# Patient Record
Sex: Female | Born: 1980 | Race: White | Hispanic: No | Marital: Married | Smoking: Never smoker
Health system: Southern US, Community
[De-identification: ages and names within clinical notes are randomized; demographics above are authoritative.]

## PROBLEM LIST (undated history)

## (undated) DIAGNOSIS — R748 Abnormal levels of other serum enzymes: Secondary | ICD-10-CM

## (undated) DIAGNOSIS — R1013 Epigastric pain: Secondary | ICD-10-CM

## (undated) HISTORY — PX: DILATION AND CURETTAGE OF UTERUS: SHX78

---

## 2012-05-03 ENCOUNTER — Ambulatory Visit (INDEPENDENT_AMBULATORY_CARE_PROVIDER_SITE_OTHER): Payer: BC Managed Care – PPO | Admitting: Family Medicine

## 2012-05-03 VITALS — BP 90/58 | HR 58 | Temp 98.8°F | Resp 16 | Ht 66.0 in | Wt 131.0 lb

## 2012-05-03 DIAGNOSIS — J329 Chronic sinusitis, unspecified: Secondary | ICD-10-CM

## 2012-05-03 DIAGNOSIS — J029 Acute pharyngitis, unspecified: Secondary | ICD-10-CM

## 2012-05-03 LAB — POCT RAPID STREP A (OFFICE): Rapid Strep A Screen: NEGATIVE

## 2012-05-03 MED ORDER — AMOXICILLIN 875 MG PO TABS: 875.0000 mg | ORAL_TABLET | Freq: Two times a day (BID) | ORAL | Status: DC

## 2012-05-03 NOTE — Progress Notes (Signed)
    Patient ID: Anita Reyes MRN: [id number], DOB: 04-Apr-1981, 31 y.o. Date of Encounter: 05/03/2012, 11:32 AM  Primary Physician: Provider Not In System  Chief Complaint:  Chief Complaint  Patient presents with  . Cough    4 days  . Sore Throat    started yesterday  . Generalized Body Aches    HPI: 31 y.o. year old female presents with 3 day history of nasal congestion, post nasal drip, sore throat, sinus pressure, and cough. Afebrile. No chills. Nasal congestion thick and green/yellow. Cough is productive secondary to post nasal drip and not associated with time of day. Ears feel full, leading to sensation of muffled hearing. Has tried OTC cold preps without success. No GI complaints.   No recent antibiotics, recent travels, or sick contacts   No leg trauma, sedentary periods, h/o cancer, or tobacco use.  Patient is nursing.  History reviewed. No pertinent past medical history.   Home Meds: Prior to Admission medications   Not on File    Allergies: No Known Allergies  History   Social History  . Marital Status: Married    Spouse Name: N/A    Number of Children: N/A  . Years of Education: N/A   Occupational History  . Not on file.   Social History Main Topics  . Smoking status: Never Smoker   . Smokeless tobacco: Not on file  . Alcohol Use: Not on file  . Drug Use: Not on file  . Sexually Active: Not on file   Other Topics Concern  . Not on file   Social History Narrative  . No narrative on file     Review of Systems: Constitutional: negative for chills, fever, night sweats or weight changes Cardiovascular: negative for chest pain or palpitations Respiratory: negative for hemoptysis, wheezing, or shortness of breath Abdominal: negative for abdominal pain, nausea, vomiting or diarrhea Dermatological: negative for rash Neurologic: negative for headache   Physical Exam: Blood pressure 90/58, pulse 58, temperature 98.8 F (37.1 C), temperature  source Oral, resp. rate 16, height 5\' 6"  (1.676 m), weight 131 lb (59.421 kg), SpO2 98.00%., Body mass index is 21.14 kg/(m^2). General: Well developed, well nourished, in no acute distress. Head: Normocephalic, atraumatic, eyes without discharge, sclera non-icteric, nares are congested. Bilateral auditory canals clear, TM's are without perforation, pearly grey with reflective cone of light bilaterally. Serous effusion bilaterally behind TM's. Maxillary sinus TTP. Oral cavity moist, dentition normal. Posterior pharynx with post nasal drip and moderate erythema. No peritonsillar abscess or tonsillar exudate. Neck: Supple. No thyromegaly. Full ROM. No lymphadenopathy. Lungs: Clear bilaterally to auscultation without wheezes, rales, or rhonchi. Breathing is unlabored.  Heart: RRR with S1 S2. No murmurs, rubs, or gallops appreciated. Msk:  Strength and tone normal for age. Extremities: No clubbing or cyanosis. No edema. Neuro: Alert and oriented X 3. Moves all extremities spontaneously. CNII-XII grossly in tact. Psych:  Responds to questions appropriately with a normal affect.   Results for orders placed in visit on 05/03/12  POCT RAPID STREP A (OFFICE)      Component Value Range   Rapid Strep A Screen Negative  Negative      ASSESSMENT AND PLAN:  31 y.o. year old female with pharyngitis and sinus congestion while nursing. -  -Tylenol/Motrin prn -Rest/fluids -RTC precautions -RTC 3-5 days if no improvement  Signed, Elvina Sidle, MD 05/03/2012 11:32 AM

## 2016-01-19 DIAGNOSIS — Z3201 Encounter for pregnancy test, result positive: Secondary | ICD-10-CM | POA: Diagnosis not present

## 2016-03-24 DIAGNOSIS — O09522 Supervision of elderly multigravida, second trimester: Secondary | ICD-10-CM | POA: Diagnosis not present

## 2016-06-07 DIAGNOSIS — Z3A28 28 weeks gestation of pregnancy: Secondary | ICD-10-CM | POA: Diagnosis not present

## 2016-06-07 DIAGNOSIS — Z3483 Encounter for supervision of other normal pregnancy, third trimester: Secondary | ICD-10-CM | POA: Diagnosis not present

## 2016-06-23 DIAGNOSIS — Z23 Encounter for immunization: Secondary | ICD-10-CM | POA: Diagnosis not present

## 2016-07-28 DIAGNOSIS — Z3685 Encounter for antenatal screening for Streptococcus B: Secondary | ICD-10-CM | POA: Diagnosis not present

## 2016-08-01 DIAGNOSIS — Z0100 Encounter for examination of eyes and vision without abnormal findings: Secondary | ICD-10-CM | POA: Diagnosis not present

## 2016-08-12 DIAGNOSIS — O471 False labor at or after 37 completed weeks of gestation: Secondary | ICD-10-CM | POA: Diagnosis not present

## 2016-08-19 DIAGNOSIS — O34219 Maternal care for unspecified type scar from previous cesarean delivery: Secondary | ICD-10-CM | POA: Diagnosis not present

## 2016-08-19 DIAGNOSIS — Z3A00 Weeks of gestation of pregnancy not specified: Secondary | ICD-10-CM | POA: Diagnosis not present

## 2016-08-19 DIAGNOSIS — Z331 Pregnant state, incidental: Secondary | ICD-10-CM | POA: Diagnosis not present

## 2016-08-19 DIAGNOSIS — Z3A39 39 weeks gestation of pregnancy: Secondary | ICD-10-CM | POA: Diagnosis not present

## 2016-08-20 DIAGNOSIS — O34219 Maternal care for unspecified type scar from previous cesarean delivery: Secondary | ICD-10-CM | POA: Diagnosis not present

## 2016-08-20 DIAGNOSIS — Z3A39 39 weeks gestation of pregnancy: Secondary | ICD-10-CM | POA: Diagnosis not present

## 2016-09-02 DIAGNOSIS — M6208 Separation of muscle (nontraumatic), other site: Secondary | ICD-10-CM | POA: Diagnosis not present

## 2016-10-04 DIAGNOSIS — M6208 Separation of muscle (nontraumatic), other site: Secondary | ICD-10-CM | POA: Diagnosis not present

## 2017-12-20 DIAGNOSIS — Z3A01 Less than 8 weeks gestation of pregnancy: Secondary | ICD-10-CM | POA: Diagnosis not present

## 2017-12-20 DIAGNOSIS — O09291 Supervision of pregnancy with other poor reproductive or obstetric history, first trimester: Secondary | ICD-10-CM | POA: Diagnosis not present

## 2017-12-20 DIAGNOSIS — Z3201 Encounter for pregnancy test, result positive: Secondary | ICD-10-CM | POA: Diagnosis not present

## 2017-12-22 DIAGNOSIS — Z3A01 Less than 8 weeks gestation of pregnancy: Secondary | ICD-10-CM | POA: Diagnosis not present

## 2017-12-22 DIAGNOSIS — O09291 Supervision of pregnancy with other poor reproductive or obstetric history, first trimester: Secondary | ICD-10-CM | POA: Diagnosis not present

## 2017-12-25 DIAGNOSIS — Z3201 Encounter for pregnancy test, result positive: Secondary | ICD-10-CM | POA: Diagnosis not present

## 2017-12-25 DIAGNOSIS — O09299 Supervision of pregnancy with other poor reproductive or obstetric history, unspecified trimester: Secondary | ICD-10-CM | POA: Diagnosis not present

## 2017-12-25 DIAGNOSIS — Z3A08 8 weeks gestation of pregnancy: Secondary | ICD-10-CM | POA: Diagnosis not present

## 2017-12-25 DIAGNOSIS — Z6823 Body mass index (BMI) 23.0-23.9, adult: Secondary | ICD-10-CM | POA: Diagnosis not present

## 2018-01-12 DIAGNOSIS — Z3A10 10 weeks gestation of pregnancy: Secondary | ICD-10-CM | POA: Diagnosis not present

## 2018-01-12 DIAGNOSIS — Z124 Encounter for screening for malignant neoplasm of cervix: Secondary | ICD-10-CM | POA: Diagnosis not present

## 2018-01-12 DIAGNOSIS — Z113 Encounter for screening for infections with a predominantly sexual mode of transmission: Secondary | ICD-10-CM | POA: Diagnosis not present

## 2018-01-12 DIAGNOSIS — Z01419 Encounter for gynecological examination (general) (routine) without abnormal findings: Secondary | ICD-10-CM | POA: Diagnosis not present

## 2018-01-12 DIAGNOSIS — O09299 Supervision of pregnancy with other poor reproductive or obstetric history, unspecified trimester: Secondary | ICD-10-CM | POA: Diagnosis not present

## 2018-01-12 DIAGNOSIS — Z118 Encounter for screening for other infectious and parasitic diseases: Secondary | ICD-10-CM | POA: Diagnosis not present

## 2018-01-12 LAB — OB RESULTS CONSOLE ABO/RH: RH Type: POSITIVE

## 2018-01-12 LAB — OB RESULTS CONSOLE GC/CHLAMYDIA
Chlamydia: NEGATIVE
GC PROBE AMP, GENITAL: NEGATIVE

## 2018-01-12 LAB — OB RESULTS CONSOLE ANTIBODY SCREEN: Antibody Screen: NEGATIVE

## 2018-01-12 LAB — OB RESULTS CONSOLE RUBELLA ANTIBODY, IGM: Rubella: IMMUNE

## 2018-01-12 LAB — OB RESULTS CONSOLE RPR: RPR: NONREACTIVE

## 2018-01-12 LAB — OB RESULTS CONSOLE HEPATITIS B SURFACE ANTIGEN: Hepatitis B Surface Ag: NEGATIVE

## 2018-01-12 LAB — OB RESULTS CONSOLE HIV ANTIBODY (ROUTINE TESTING): HIV: NONREACTIVE

## 2018-01-25 DIAGNOSIS — Z3A12 12 weeks gestation of pregnancy: Secondary | ICD-10-CM | POA: Diagnosis not present

## 2018-01-25 DIAGNOSIS — Z23 Encounter for immunization: Secondary | ICD-10-CM | POA: Diagnosis not present

## 2018-01-25 DIAGNOSIS — O09291 Supervision of pregnancy with other poor reproductive or obstetric history, first trimester: Secondary | ICD-10-CM | POA: Diagnosis not present

## 2018-02-21 DIAGNOSIS — O09292 Supervision of pregnancy with other poor reproductive or obstetric history, second trimester: Secondary | ICD-10-CM | POA: Diagnosis not present

## 2018-02-21 DIAGNOSIS — Z3A16 16 weeks gestation of pregnancy: Secondary | ICD-10-CM | POA: Diagnosis not present

## 2018-02-21 DIAGNOSIS — Z361 Encounter for antenatal screening for raised alphafetoprotein level: Secondary | ICD-10-CM | POA: Diagnosis not present

## 2018-03-09 DIAGNOSIS — Z3A18 18 weeks gestation of pregnancy: Secondary | ICD-10-CM | POA: Diagnosis not present

## 2018-03-09 DIAGNOSIS — O09292 Supervision of pregnancy with other poor reproductive or obstetric history, second trimester: Secondary | ICD-10-CM | POA: Diagnosis not present

## 2018-04-10 DIAGNOSIS — Z3A23 23 weeks gestation of pregnancy: Secondary | ICD-10-CM | POA: Diagnosis not present

## 2018-04-10 DIAGNOSIS — O09292 Supervision of pregnancy with other poor reproductive or obstetric history, second trimester: Secondary | ICD-10-CM | POA: Diagnosis not present

## 2018-05-09 NOTE — L&D Delivery Note (Signed)
Delivery Note:   Complete dilation at 1359  07/31/2018  Onset of pushing at 1400 FHR second stage Category 2, variable decels nadir 60 Rapid progress from 4 cm to complete over 30 min. Pushed in L land R lateral position, L lateral for delivery Congested perineum and labia majus / varicosities  Analgesia Eliezer Lofts intrapartum:Epidural   Delivery of a Live born female  Position: cephalic OA to ROT, easy shoulders and body Birth Weight:  pending APGAR: 8, 9  Newborn Delivery   Birth date/time:  07/31/2018 14:29:00 Delivery type:  Vaginal, Spontaneous     Nuchal Cord: tight x 1, removed after delivery of body, somersault body on perineum Cord double clamped after cessation of pulsation, cut by FOB.  Collection of cord blood donation-None  Arterial cord blood sample-No    Placenta delivered-Spontaneous  with 3 vessels . Placenta to L&D for disposal. Uterine tone firm, bleeding small  Anesthesia:Epidural  Labial Laceration:2nd degree;Perineal  Repair: 3.0 vicryl Est. Blood Loss (mL):200.00   Complications: ,Other Labor Complications:  Mom to postpartum.  Baby to Couplet care / Skin to Skin.  Delivery Report:  Review the Delivery Report for details.     Signed: [name], CNM, MSN 07/31/2018, 2:59 PM

## 2018-05-11 DIAGNOSIS — O09292 Supervision of pregnancy with other poor reproductive or obstetric history, second trimester: Secondary | ICD-10-CM | POA: Diagnosis not present

## 2018-05-11 DIAGNOSIS — Z3689 Encounter for other specified antenatal screening: Secondary | ICD-10-CM | POA: Diagnosis not present

## 2018-05-11 DIAGNOSIS — Z3A27 27 weeks gestation of pregnancy: Secondary | ICD-10-CM | POA: Diagnosis not present

## 2018-05-22 DIAGNOSIS — O09293 Supervision of pregnancy with other poor reproductive or obstetric history, third trimester: Secondary | ICD-10-CM | POA: Diagnosis not present

## 2018-05-22 DIAGNOSIS — Z3689 Encounter for other specified antenatal screening: Secondary | ICD-10-CM | POA: Diagnosis not present

## 2018-05-22 DIAGNOSIS — Z3A29 29 weeks gestation of pregnancy: Secondary | ICD-10-CM | POA: Diagnosis not present

## 2018-06-06 DIAGNOSIS — Z3A31 31 weeks gestation of pregnancy: Secondary | ICD-10-CM | POA: Diagnosis not present

## 2018-06-06 DIAGNOSIS — Z23 Encounter for immunization: Secondary | ICD-10-CM | POA: Diagnosis not present

## 2018-06-06 DIAGNOSIS — O09293 Supervision of pregnancy with other poor reproductive or obstetric history, third trimester: Secondary | ICD-10-CM | POA: Diagnosis not present

## 2018-06-20 DIAGNOSIS — O09293 Supervision of pregnancy with other poor reproductive or obstetric history, third trimester: Secondary | ICD-10-CM | POA: Diagnosis not present

## 2018-06-20 DIAGNOSIS — Z6833 Body mass index (BMI) 33.0-33.9, adult: Secondary | ICD-10-CM | POA: Diagnosis not present

## 2018-06-20 DIAGNOSIS — O403XX0 Polyhydramnios, third trimester, not applicable or unspecified: Secondary | ICD-10-CM | POA: Diagnosis not present

## 2018-07-06 DIAGNOSIS — Z3685 Encounter for antenatal screening for Streptococcus B: Secondary | ICD-10-CM | POA: Diagnosis not present

## 2018-07-06 DIAGNOSIS — Z3A35 35 weeks gestation of pregnancy: Secondary | ICD-10-CM | POA: Diagnosis not present

## 2018-07-06 DIAGNOSIS — O09293 Supervision of pregnancy with other poor reproductive or obstetric history, third trimester: Secondary | ICD-10-CM | POA: Diagnosis not present

## 2018-07-06 DIAGNOSIS — O3663X0 Maternal care for excessive fetal growth, third trimester, not applicable or unspecified: Secondary | ICD-10-CM | POA: Diagnosis not present

## 2018-07-06 DIAGNOSIS — O403XX0 Polyhydramnios, third trimester, not applicable or unspecified: Secondary | ICD-10-CM | POA: Diagnosis not present

## 2018-07-06 LAB — OB RESULTS CONSOLE GBS: STREP GROUP B AG: NEGATIVE

## 2018-07-18 DIAGNOSIS — Z3A37 37 weeks gestation of pregnancy: Secondary | ICD-10-CM | POA: Diagnosis not present

## 2018-07-18 DIAGNOSIS — O09523 Supervision of elderly multigravida, third trimester: Secondary | ICD-10-CM | POA: Diagnosis not present

## 2018-07-19 DIAGNOSIS — Z3A37 37 weeks gestation of pregnancy: Secondary | ICD-10-CM | POA: Diagnosis not present

## 2018-07-19 DIAGNOSIS — O09523 Supervision of elderly multigravida, third trimester: Secondary | ICD-10-CM | POA: Diagnosis not present

## 2018-07-19 DIAGNOSIS — O3663X0 Maternal care for excessive fetal growth, third trimester, not applicable or unspecified: Secondary | ICD-10-CM | POA: Diagnosis not present

## 2018-07-20 ENCOUNTER — Other Ambulatory Visit: Payer: Self-pay

## 2018-07-20 ENCOUNTER — Encounter (HOSPITAL_COMMUNITY): Payer: Self-pay

## 2018-07-20 ENCOUNTER — Inpatient Hospital Stay (HOSPITAL_COMMUNITY)
Admission: AD | Admit: 2018-07-20 | Discharge: 2018-07-20 | Disposition: A | Discharge status: Home / Self Care | Payer: BLUE CROSS/BLUE SHIELD | Attending: Obstetrics & Gynecology | Admitting: Obstetrics & Gynecology

## 2018-07-20 DIAGNOSIS — Z3A37 37 weeks gestation of pregnancy: Secondary | ICD-10-CM

## 2018-07-20 DIAGNOSIS — O479 False labor, unspecified: Secondary | ICD-10-CM

## 2018-07-20 DIAGNOSIS — O471 False labor at or after 37 completed weeks of gestation: Secondary | ICD-10-CM | POA: Diagnosis not present

## 2018-07-20 NOTE — MAU Note (Signed)
CTX every 5-7 minutes.  No LOF/VB.  2 cm on last exam.  + FM.

## 2018-07-20 NOTE — MAU Provider Note (Signed)
S: Ms. Anita Reyes is a 38 y.o. [id number] at [redacted]w[redacted]d  who presents to MAU today for labor evaluation.     Cervical exam by RN:  Dilation: 2.5 Effacement (%): 40, 50 Cervical Position: Middle Station: Ballotable Exam by:: [employer] CNM   Fetal Monitoring: Baseline: 135 Variability: moderate Accelerations: present Decelerations: variable x 1, isolated Contractions: 2-5 minutes  MDM NST reviewed and overall reactive with isolated variable while pt sitting up in bed, followed by 1+ hour of reactive NST without deceleration CNM called to room to evaluate cervix by RN, Cervix 2.5/60/-3, unchanged from RN exam.  No evidence of active labor. Pt with hx rapid labor, offered for her to stay and walk or go home now. Pt decided to walk but in 15-20 minutes asked to be discharged since contractions remained mild and unchanged. D/C home with labor precautions and fetal kick counts.  A: [facility] at [redacted]w[redacted]d  False labor  P: Discharge home Labor precautions and kick counts included in AVS Patient to follow-up with [facility] as scheduled  Patient may return to MAU as needed or when in labor   Hurshel Party, CNM 07/20/2018 4:42 AM

## 2018-07-20 NOTE — Discharge Instructions (Signed)
Reasons to return to MAU:  1.  Contractions are  5 minutes apart or less, each last 1 minute, these have been going on for 1-2 hours, and you cannot walk or talk during them 2.  You have a large gush of fluid, or a trickle of fluid that will not stop and you have to wear a pad 3.  You have bleeding that is bright red, heavier than spotting--like menstrual bleeding (spotting can be normal in early labor or after a check of your cervix) 4.  You do not feel the baby moving like he/she normally does   Pain Relief During Labor and Delivery Many things can cause pain during labor and delivery, including:  Pressure on bones and ligaments due to the baby moving through the pelvis.  Stretching of tissues due to the baby moving through the birth canal.  Muscle tension due to anxiety or nervousness.  The uterus tightening (contracting) and relaxing to help move the baby. There are many ways to deal with the pain of labor and delivery. They include:  Taking prenatal classes. Taking these classes helps you know what to expect during your babys birth. What you learn will increase your confidence and decrease your anxiety.  Practicing relaxation techniques or doing relaxing activities, such as: ? Focused breathing. ? Meditation. ? Visualization. ? Aroma therapy. ? Listening to your favorite music. ? Hypnosis.  Taking a warm shower or bath (hydrotherapy). This may: ? Provide comfort and relaxation. ? Lessen your perception of pain. ? Decrease the amount of pain medicine needed. ? Decrease the length of labor.  Getting a massage or counterpressure on your back.  Applying warm packs or ice packs.  Changing positions often, moving around, or using a birthing ball.  Getting: ? Pain medicine through an IV or injection into a muscle. ? Pain medicine inserted into your spinal column. ? Injections of sterile water just under the skin on your lower back (intradermal injections). ? Laughing gas  (nitrous oxide). Discuss your pain control options with your health care provider during your prenatal visits. Explore the options offered by your hospital or birth center. What kinds of medicine are available? There are two kinds of medicines that can be used to relieve pain during labor and delivery:  Analgesics. These medicines decrease pain without causing you to lose feeling or the ability to move your muscles.  Anesthetics. These medicines block feeling in the body and can decrease your ability to move freely. Both of these kinds of medicine can cause minor side effects, such as nausea, trouble concentrating, and sleepiness. They can also decrease the baby's heart rate before birth and affect the babys breathing rate after birth. For this reason, health care providers are careful about when and how much medicine is given. What are specific medicines and procedures that provide pain relief? Local Anesthetics Local anesthetics are used to numb a small area of the body. They may be used along with another kind of anesthetic or used to numb the nerves of the vagina, cervix, and perineum during the second stage of labor. General Anesthetics General anesthetics cause you to lose consciousness so you do not feel pain. They are usually only used for an emergency cesarean delivery. General anesthetics are given through an IV tube and a mask. Pudendal Block A pudendal block is a form of local anesthetic. It may be used to relieve the pain associated with pushing or stretching of the perineum at the time of delivery or to further  numb the perineum. A pudendal block is done by injecting numbing medicine through the vaginal wall into a nerve in the pelvis. Epidural Analgesia Epidural analgesia is given through a flexible IV catheter that is inserted into the lower back. Numbing medicine is delivered continuously to the area near your spinal column nerves (epidural space). After having this type of  analgesia, you may be able to move your legs but you most likely will not be able to walk. Depending on the amount of medicine given, you may lose all feeling in the lower half of your body, or you may retain some level of sensation, including the urge to push. Epidural analgesia can be used to provide pain relief for a vaginal birth. Spinal Block A spinal block is similar to epidural analgesia, but the medicine is injected into the spinal fluid instead of the epidural space. A spinal block is only given once. It starts to relieve pain quickly, but the pain relief lasts only 1-6 hours. Spinal blocks can be used for cesarean deliveries. Combined Spinal-Epidural (CSE) Block A CSE block combines the effects of a spinal block and epidural analgesia. The spinal block works quickly to block all pain. The epidural analgesia provides continuous pain relief, even after the effects of the spinal block have worn off. This information is not intended to replace advice given to you by your health care provider. Make sure you discuss any questions you have with your health care provider. Document Released: 08/11/2008 Document Revised: 10/02/2015 Document Reviewed: 09/16/2015 Elsevier Interactive Patient Education  2019 [employer].

## 2018-07-23 ENCOUNTER — Telehealth (HOSPITAL_COMMUNITY): Payer: Self-pay | Admitting: *Deleted

## 2018-07-23 ENCOUNTER — Encounter (HOSPITAL_COMMUNITY): Payer: Self-pay | Admitting: *Deleted

## 2018-07-25 DIAGNOSIS — Z3A38 38 weeks gestation of pregnancy: Secondary | ICD-10-CM | POA: Diagnosis not present

## 2018-07-25 DIAGNOSIS — O3663X0 Maternal care for excessive fetal growth, third trimester, not applicable or unspecified: Secondary | ICD-10-CM | POA: Diagnosis not present

## 2018-07-25 DIAGNOSIS — O09523 Supervision of elderly multigravida, third trimester: Secondary | ICD-10-CM | POA: Diagnosis not present

## 2018-07-31 ENCOUNTER — Inpatient Hospital Stay (HOSPITAL_COMMUNITY): Payer: BLUE CROSS/BLUE SHIELD | Admitting: Anesthesiology

## 2018-07-31 ENCOUNTER — Inpatient Hospital Stay (HOSPITAL_COMMUNITY): Payer: BLUE CROSS/BLUE SHIELD

## 2018-07-31 ENCOUNTER — Other Ambulatory Visit: Payer: Self-pay

## 2018-07-31 ENCOUNTER — Inpatient Hospital Stay (HOSPITAL_COMMUNITY)
Admission: AD | Admit: 2018-07-31 | Discharge: 2018-08-01 | DRG: 806 | Disposition: A | Discharge status: Home / Self Care | Payer: BLUE CROSS/BLUE SHIELD

## 2018-07-31 ENCOUNTER — Encounter (HOSPITAL_COMMUNITY): Payer: Self-pay

## 2018-07-31 DIAGNOSIS — N473 Deficient foreskin: Secondary | ICD-10-CM | POA: Diagnosis not present

## 2018-07-31 DIAGNOSIS — O878 Other venous complications in the puerperium: Secondary | ICD-10-CM | POA: Diagnosis present

## 2018-07-31 DIAGNOSIS — Z23 Encounter for immunization: Secondary | ICD-10-CM | POA: Diagnosis not present

## 2018-07-31 DIAGNOSIS — Z3A39 39 weeks gestation of pregnancy: Secondary | ICD-10-CM | POA: Diagnosis not present

## 2018-07-31 DIAGNOSIS — O34219 Maternal care for unspecified type scar from previous cesarean delivery: Principal | ICD-10-CM | POA: Diagnosis not present

## 2018-07-31 DIAGNOSIS — O34211 Maternal care for low transverse scar from previous cesarean delivery: Secondary | ICD-10-CM | POA: Diagnosis not present

## 2018-07-31 DIAGNOSIS — O2243 Hemorrhoids in pregnancy, third trimester: Secondary | ICD-10-CM | POA: Diagnosis not present

## 2018-07-31 LAB — TYPE AND SCREEN
ABO/RH(D): A POS
Antibody Screen: NEGATIVE

## 2018-07-31 LAB — CBC
HCT: 37.8 % (ref 36.0–46.0)
Hemoglobin: 12.5 g/dL (ref 12.0–15.0)
MCH: 29.2 pg (ref 26.0–34.0)
MCHC: 33.1 g/dL (ref 30.0–36.0)
MCV: 88.3 fL (ref 80.0–100.0)
Platelets: 175 10*3/uL (ref 150–400)
RBC: 4.28 MIL/uL (ref 3.87–5.11)
RDW: 14.5 % (ref 11.5–15.5)
WBC: 9.2 10*3/uL (ref 4.0–10.5)
nRBC: 0 % (ref 0.0–0.2)

## 2018-07-31 LAB — ABO/RH: ABO/RH(D): A POS

## 2018-07-31 MED ORDER — PHENYLEPHRINE 40 MCG/ML (10ML) SYRINGE FOR IV PUSH (FOR BLOOD PRESSURE SUPPORT)
80.0000 ug | PREFILLED_SYRINGE | INTRAVENOUS | Status: DC | PRN
  Filled 2018-07-31: qty 10

## 2018-07-31 MED ORDER — SODIUM CHLORIDE (PF) 0.9 % IJ SOLN
INTRAMUSCULAR | Status: DC | PRN
  Administered 2018-07-31: 12 mL/h via EPIDURAL

## 2018-07-31 MED ORDER — FENTANYL-BUPIVACAINE-NACL 0.5-0.125-0.9 MG/250ML-% EP SOLN
12.0000 mL/h | EPIDURAL | Status: DC | PRN
  Filled 2018-07-31: qty 250

## 2018-07-31 MED ORDER — LACTATED RINGERS IV SOLN: 500.0000 mL | Freq: Once | INTRAVENOUS | Status: DC

## 2018-07-31 MED ORDER — WITCH HAZEL-GLYCERIN EX PADS
1.0000 "application " | MEDICATED_PAD | CUTANEOUS | Status: DC | PRN
  Administered 2018-08-01: 1 via TOPICAL

## 2018-07-31 MED ORDER — ONDANSETRON HCL 4 MG/2ML IJ SOLN: 4.0000 mg | Freq: Four times a day (QID) | INTRAMUSCULAR | Status: DC | PRN

## 2018-07-31 MED ORDER — OXYTOCIN BOLUS FROM INFUSION
500.0000 mL | Freq: Once | INTRAVENOUS | Status: AC
  Administered 2018-07-31: 500 mL via INTRAVENOUS

## 2018-07-31 MED ORDER — OXYCODONE-ACETAMINOPHEN 5-325 MG PO TABS
1.0000 | ORAL_TABLET | ORAL | Status: DC | PRN
  Administered 2018-07-31: 1 via ORAL
  Filled 2018-07-31: qty 1

## 2018-07-31 MED ORDER — OXYTOCIN 10 UNIT/ML IJ SOLN: 10.0000 [IU] | Freq: Once | INTRAMUSCULAR | Status: DC

## 2018-07-31 MED ORDER — PRENATAL MULTIVITAMIN CH
1.0000 | ORAL_TABLET | Freq: Every day | ORAL | Status: DC
  Administered 2018-08-01: 1 via ORAL
  Filled 2018-07-31: qty 1

## 2018-07-31 MED ORDER — EPHEDRINE 5 MG/ML INJ: 10.0000 mg | INTRAVENOUS | Status: DC | PRN

## 2018-07-31 MED ORDER — ONDANSETRON HCL 4 MG PO TABS: 4.0000 mg | ORAL_TABLET | ORAL | Status: DC | PRN

## 2018-07-31 MED ORDER — FLEET ENEMA 7-19 GM/118ML RE ENEM: 1.0000 | ENEMA | Freq: Every day | RECTAL | Status: DC | PRN

## 2018-07-31 MED ORDER — LIDOCAINE HCL (PF) 1 % IJ SOLN
INTRAMUSCULAR | Status: DC | PRN
  Administered 2018-07-31: 5 mL via EPIDURAL

## 2018-07-31 MED ORDER — OXYTOCIN 40 UNITS IN NORMAL SALINE INFUSION - SIMPLE MED
2.5000 [IU]/h | INTRAVENOUS | Status: DC
  Filled 2018-07-31: qty 1000

## 2018-07-31 MED ORDER — IBUPROFEN 600 MG PO TABS
600.0000 mg | ORAL_TABLET | Freq: Four times a day (QID) | ORAL | Status: DC
  Administered 2018-07-31 – 2018-08-01 (×4): 600 mg via ORAL
  Filled 2018-07-31 (×4): qty 1

## 2018-07-31 MED ORDER — TERBUTALINE SULFATE 1 MG/ML IJ SOLN: 0.2500 mg | Freq: Once | INTRAMUSCULAR | Status: DC | PRN

## 2018-07-31 MED ORDER — LACTATED RINGERS IV SOLN
500.0000 mL | INTRAVENOUS | Status: DC | PRN
  Administered 2018-07-31 (×3): 500 mL via INTRAVENOUS

## 2018-07-31 MED ORDER — DIPHENHYDRAMINE HCL 50 MG/ML IJ SOLN: 12.5000 mg | INTRAMUSCULAR | Status: DC | PRN

## 2018-07-31 MED ORDER — DIBUCAINE 1 % RE OINT: 1.0000 "application " | TOPICAL_OINTMENT | RECTAL | Status: DC | PRN

## 2018-07-31 MED ORDER — TETANUS-DIPHTH-ACELL PERTUSSIS 5-2.5-18.5 LF-MCG/0.5 IM SUSP: 0.5000 mL | Freq: Once | INTRAMUSCULAR | Status: DC

## 2018-07-31 MED ORDER — LIDOCAINE HCL (PF) 1 % IJ SOLN: 30.0000 mL | INTRAMUSCULAR | Status: DC | PRN

## 2018-07-31 MED ORDER — SIMETHICONE 80 MG PO CHEW
80.0000 mg | CHEWABLE_TABLET | ORAL | Status: DC | PRN
  Administered 2018-08-01: 80 mg via ORAL
  Filled 2018-07-31: qty 1

## 2018-07-31 MED ORDER — OXYTOCIN 40 UNITS IN NORMAL SALINE INFUSION - SIMPLE MED
1.0000 m[IU]/min | INTRAVENOUS | Status: DC
  Administered 2018-07-31: 2 m[IU]/min via INTRAVENOUS

## 2018-07-31 MED ORDER — PHENYLEPHRINE 40 MCG/ML (10ML) SYRINGE FOR IV PUSH (FOR BLOOD PRESSURE SUPPORT)
80.0000 ug | PREFILLED_SYRINGE | INTRAVENOUS | Status: DC | PRN
  Administered 2018-07-31 (×2): 80 ug via INTRAVENOUS

## 2018-07-31 MED ORDER — DOCUSATE SODIUM 100 MG PO CAPS
100.0000 mg | ORAL_CAPSULE | Freq: Two times a day (BID) | ORAL | Status: DC
  Administered 2018-07-31 – 2018-08-01 (×2): 100 mg via ORAL
  Filled 2018-07-31 (×2): qty 1

## 2018-07-31 MED ORDER — OXYCODONE HCL 5 MG PO TABS
5.0000 mg | ORAL_TABLET | ORAL | Status: DC | PRN
  Administered 2018-07-31 – 2018-08-01 (×2): 5 mg via ORAL
  Filled 2018-07-31 (×2): qty 1

## 2018-07-31 MED ORDER — BISACODYL 10 MG RE SUPP: 10.0000 mg | Freq: Every day | RECTAL | Status: DC | PRN

## 2018-07-31 MED ORDER — ACETAMINOPHEN 325 MG PO TABS
650.0000 mg | ORAL_TABLET | ORAL | Status: DC | PRN
  Administered 2018-08-01: 650 mg via ORAL
  Filled 2018-07-31: qty 2

## 2018-07-31 MED ORDER — OXYCODONE-ACETAMINOPHEN 5-325 MG PO TABS: 2.0000 | ORAL_TABLET | ORAL | Status: DC | PRN

## 2018-07-31 MED ORDER — SOD CITRATE-CITRIC ACID 500-334 MG/5ML PO SOLN: 30.0000 mL | ORAL | Status: DC | PRN

## 2018-07-31 MED ORDER — COCONUT OIL OIL
1.0000 "application " | TOPICAL_OIL | Status: DC | PRN
  Administered 2018-07-31: 1 via TOPICAL

## 2018-07-31 MED ORDER — ONDANSETRON HCL 4 MG/2ML IJ SOLN: 4.0000 mg | INTRAMUSCULAR | Status: DC | PRN

## 2018-07-31 MED ORDER — LACTATED RINGERS IV SOLN
INTRAVENOUS | Status: DC
  Administered 2018-07-31 (×2): 125 mL/h via INTRAVENOUS

## 2018-07-31 MED ORDER — ACETAMINOPHEN 325 MG PO TABS: 650.0000 mg | ORAL_TABLET | ORAL | Status: DC | PRN

## 2018-07-31 MED ORDER — BENZOCAINE-MENTHOL 20-0.5 % EX AERO
1.0000 "application " | INHALATION_SPRAY | CUTANEOUS | Status: DC | PRN
  Administered 2018-07-31: 1 via TOPICAL
  Filled 2018-07-31: qty 56

## 2018-07-31 MED ORDER — DIPHENHYDRAMINE HCL 25 MG PO CAPS: 25.0000 mg | ORAL_CAPSULE | Freq: Four times a day (QID) | ORAL | Status: DC | PRN

## 2018-07-31 NOTE — Progress Notes (Signed)
Abdominal binder placed by CNM Renae Fickle and Luz Brazen RN

## 2018-07-31 NOTE — Progress Notes (Signed)
Patient ID: Anita Reyes, female   DOB: Jan 04, 1981, 38 y.o.   MRN: [id number] S: Comfortable w/ epidural, Pitocin started at 2 mu/min. Hypotensive episode post epidural, now resolved.  O:  Vitals:   07/31/18 1200 07/31/18 1202 07/31/18 1205 07/31/18 1206  BP: (!) 92/47 (!) 92/47  (!) 116/40  Pulse: 81 81  75  Resp:      Temp:      TempSrc:      SpO2: 99%  99%   Weight:      Height:        FHR 140, mod var, + accels, couple variable decels with AROM, now resolved Ctx irregular  SVE 4/80/-1, vertex  AROM clear copious amt AF Abdominal binder on for laxity / prevent malposition  A/P:  IOL at term, TOLAC FHT cat 1 GBS neg Epidural effective  AROM  Pitocin titrate continue Anticipate VBAC   [name], MSN, CNM 07/31/2018, 12:10 PM

## 2018-07-31 NOTE — Anesthesia Procedure Notes (Signed)
Epidural Patient location during procedure: OB Start time: 07/31/2018 10:56 AM End time: 07/31/2018 11:09 AM  Staffing Anesthesiologist: Trevor Iha, MD Performed: anesthesiologist   Preanesthetic Checklist Completed: patient identified, site marked, surgical consent, pre-op evaluation, timeout performed, IV checked, risks and benefits discussed and monitors and equipment checked  Epidural Patient position: sitting Prep: site prepped and draped and DuraPrep Patient monitoring: continuous pulse ox and blood pressure Approach: midline Location: L3-L4 Injection technique: LOR air  Needle:  Needle type: Tuohy  Needle gauge: 17 G Needle length: 9 cm and 9 Needle insertion depth: 7 cm Catheter type: closed end flexible Catheter size: 19 Gauge Catheter at skin depth: 12 cm Test dose: negative  Assessment Events: blood not aspirated, injection not painful, no injection resistance, negative IV test and no paresthesia  Additional Notes Patient identified. Risks/Benefits/Options discussed with patient including but not limited to bleeding, infection, nerve damage, paralysis, failed block, incomplete pain control, headache, blood pressure changes, nausea, vomiting, reactions to medication both or allergic, itching and postpartum back pain. Confirmed with bedside nurse the patient's most recent platelet count. Confirmed with patient that they are not currently taking any anticoagulation, have any bleeding history or any family history of bleeding disorders. Patient expressed understanding and wished to proceed. All questions were answered. Sterile technique was used throughout the entire procedure. Please see nursing notes for vital signs. Test dose was given through epidural needle and negative prior to continuing to dose epidural or start infusion. Warning signs of high block given to the patient including shortness of breath, tingling/numbness in hands, complete motor block, or any  concerning symptoms with instructions to call for help. Patient was given instructions on fall risk and not to get out of bed. All questions and concerns addressed with instructions to call with any issues. 1 Attempt (S) . Patient tolerated procedure well.

## 2018-07-31 NOTE — Anesthesia Preprocedure Evaluation (Signed)
Anesthesia Evaluation  Patient identified by MRN, date of birth, ID band Patient awake    Reviewed: Allergy & Precautions, NPO status , Patient's Chart, lab work & pertinent test results  Airway Mallampati: II  TM Distance: >3 FB Neck ROM: Full    Dental no notable dental hx. (+) Teeth Intact, Dental Advisory Given   Pulmonary neg pulmonary ROS,    Pulmonary exam normal breath sounds clear to auscultation       Cardiovascular Exercise Tolerance: Good negative cardio ROS Normal cardiovascular exam Rhythm:Regular Rate:Normal     Neuro/Psych negative neurological ROS     GI/Hepatic negative GI ROS, Neg liver ROS,   Endo/Other    Renal/GU      Musculoskeletal   Abdominal   Peds  Hematology Hgb 12.5 Plt 175   Anesthesia Other Findings   Reproductive/Obstetrics (+) Pregnancy Pt is a Trial of Labor after C/S                             Anesthesia Physical Anesthesia Plan  ASA: II  Anesthesia Plan: Epidural   Post-op Pain Management:    Induction:   PONV Risk Score and Plan:   Airway Management Planned:   Additional Equipment:   Intra-op Plan:   Post-operative Plan:   Informed Consent: I have reviewed the patients History and Physical, chart, labs and discussed the procedure including the risks, benefits and alternatives for the proposed anesthesia with the patient or authorized representative who has indicated his/her understanding and acceptance.       Plan Discussed with:   Anesthesia Plan Comments:         Anesthesia Quick Evaluation

## 2018-07-31 NOTE — H&P (Signed)
  OB ADMISSION/ HISTORY & PHYSICAL:  Admission Date: 07/31/2018  7:40 AM  Admit Diagnosis: No admission diagnoses are documented for this encounter.    Anita Reyes is a 38 y.o. female presenting for elective IOL, desires VBAC.  Prenatal History: Anita Reyes   EDC : 08/05/2018, by Other [facility]  Prenatal care at [facility] since 1st trim.   Prenatal course complicated by: Hx G1 C/S for breech, VBAC x 3  Prenatal Labs: ABO, Rh: A (09/06 0000)  Antibody: Negative (09/06 0000) Rubella: Immune (09/06 0000)  RPR: Nonreactive (09/06 0000)  HBsAg: Negative (09/06 0000)  HIV: Non-reactive (09/06 0000)  GBS: Negative (02/28 0000)  1 hr Glucola : normal Genetic Screening: Quad screen wnl Ultrasound: normal anatomy, posterior placenta  Medical / Surgical History :  Past medical history: noncontributory   Past surgical history:  Past Surgical History:  Procedure Laterality Date  . CESAREAN SECTION    . DILATION AND CURETTAGE OF UTERUS       Family History: non-contributory   Social History:  reports that she has never smoked. She does not have any smokeless tobacco history on file. No history on file for alcohol and drug.   Allergies: Patient has no known allergies.   Current Medications at time of admission:  Medications Prior to Admission  Medication Sig Dispense Refill Last Dose  . Prenatal Vit-Fe Fumarate-FA (PRENATAL MULTIVITAMIN) TABS tablet Take 1 tablet by mouth daily at 12 noon.   Past Week at Unknown time     Review of Systems: ROS No HA/NV/RUQ pain No LOF/VB, + FM  Physical Exam: Vital signs and nursing notes reviewed.  ED Triage Vitals  Enc Vitals Group     BP 07/31/18 0824 114/67     Pulse Rate 07/31/18 0824 88     Resp 07/31/18 0824 16     Temp 07/31/18 0824 98.7 F (37.1 C)     Temp Source 07/31/18 0824 Oral     SpO2 --      Weight 07/31/18 0824 180 lb (81.6 kg)     Height 07/31/18 0824 5\' 5"  (1.651 m)     Head Circumference --       Peak Flow --      Pain Score 07/31/18 0825 0     Pain Loc --      Pain Edu? --      Excl. in GC? --      General: AAO x 3, NAD Heart: RRR Lungs:CTAB Abdomen: Gravid, NT, Leopold's vtx, EFW 7.7 lbs Abdominal laxity, binder applied Extremities: no edema Genitalia / VE: Dilation: 3 Effacement (%): 50 Station: -2 Presentation: Vertex Exam by:: D Paul CNM   FHR: 150 BPM, mod variability, + accels, no decels TOCO: Ctx q 2-3  Labs:   Pending T&S, CBC, RPR    Assessment:  38 y.o. Anita Reyes at [redacted]w[redacted]d for TOLAC Hx VBAC x 3  1. NIL 2. FHR category 1 3. GBS neg 4. Desires epidural in active labor 5. Breastfeeding 6. Placenta disposal per patient request  Plan:  1. Admit to BS 2. Routine L&D orders, Pitocin titrate to ctx, AROM when vertex well applied 3. Analgesia/anesthesia PRN  4. Anticipate VBAC   Dr Cherly Hensen notified of admission / plan of care   [name] CNM, MSN 07/31/2018, 9:16 AM

## 2018-08-01 LAB — RPR: RPR Ser Ql: NONREACTIVE

## 2018-08-01 MED ORDER — IBUPROFEN 600 MG PO TABS: 600.0000 mg | ORAL_TABLET | Freq: Four times a day (QID) | ORAL | 0 refills | Status: AC

## 2018-08-01 MED ORDER — SIMETHICONE 80 MG PO CHEW: 80.0000 mg | CHEWABLE_TABLET | ORAL | 0 refills | Status: AC | PRN

## 2018-08-01 NOTE — Lactation Note (Signed)
This note was copied from a baby's chart. Lactation Consultation Note  Patient Name: Anita Reyes Date: 08/01/2018 Reason for consult: Initial assessment;Term  P5 mother whose infant is now 62 hours old.  Mother breast fed her other 4 children for 1 year each.  The children range in ages from 2 years-9 years.  Mother would like an early discharge at 24 hours and plans to have the baby circumcised prior to discharge.  Mother had no questions/concerns related to breast feeding.  Even though mother is very knowledgeable about breast feeding and has a history of successful breast feeding I reviewed basic concepts with her.    Encouraged to feed 8-12 times/24 hours or sooner if baby shows cues.  Mother is familiar with feeding cues and hand expression.  Observed her expressing colostrum drops easily.  Colostrum container provided for any EBM she obtains.  Milk storage times reviewed and finger feeding demonstrated.    Observed mother latch baby to breast without difficulty.  Her breasts are soft and non tender and nipples are everted and short shafted.  Breast tissue is compressible.  LATCH score of 9.  Discussed engorgement prevention/treatment.  Manual pump with instructions for use given.  Mother did not want to review set up or flange size, stating she was familiar with the pump and also has larger flanges at home if needed.    Mother verbalized that, at about 57 weeks of age, she has gotten mastitis with every child.  She has a prescription for nipple cream at bedside that has helped her.  It expired in 2013 and suggested she ask her OB today to write a new prescription.  Also mentioned the [facility] from [facility].  Mother will do this.  I also suggested having an OP lactation consult with Jasmine December at about 4-5 weeks if mother feels like she is becoming engorged.  Mother feels like she can always sense when this is about to happen and sounded interested in seeing Jasmine December if needed.   Explained how she can call and make an OP appointment.    Mother will call if further questions arise prior to discharge.  Father is present and very supportive.  I feel comfortable with this family being discharged at 24 hours.   Maternal Data Formula Feeding for Exclusion: No Has patient been taught Hand Expression?: Yes Does the patient have breastfeeding experience prior to this delivery?: Yes  Feeding Feeding Type: Breast Fed  LATCH Score Latch: Grasps breast easily, tongue down, lips flanged, rhythmical sucking.  Audible Swallowing: A few with stimulation  Type of Nipple: Everted at rest and after stimulation  Comfort (Breast/Nipple): Soft / non-tender  Hold (Positioning): No assistance needed to correctly position infant at breast.  LATCH Score: 9  Interventions Interventions: Breast feeding basics reviewed;Skin to skin;Breast massage;Breast compression  Lactation Tools Discussed/Used WIC Program: No   Consult Status Consult Status: Complete Date: 08/02/18 Follow-up type: Call as needed    Namiko Pritts R Jordon Kristiansen 08/01/2018, 11:14 AM

## 2018-08-01 NOTE — Discharge Summary (Signed)
Obstetric Discharge Summary   Patient Name: Anita Reyes DOB: 09/09/1980 MRN: [id number]  Date of Admission: 07/31/2018 Date of Discharge: 08/01/2018 Date of Delivery: 07/31/2018 Gestational Age at Delivery: [redacted]w[redacted]d  Primary OB: [facility] - CNM Management; Colon Flattery  Antepartum complications:  Hx G1 C/S for breech, VBAC x 3 Prenatal Labs:  ABO, Rh: A (09/06 0000)  Antibody: Negative (09/06 0000) Rubella: Immune (09/06 0000)  RPR: Nonreactive (09/06 0000)  HBsAg: Negative (09/06 0000)  HIV: Non-reactive (09/06 0000)  GBS: Negative (02/28 0000)  1 hr Glucola : normal Genetic Screening: Quad screen wnl Ultrasound: normal anatomy, posterior placenta  Admitting Diagnosis: elective IOL at 39+2 weeks   Secondary Diagnoses: Patient Active Problem List   Diagnosis Date Noted  . Labor and delivery, indication for care 07/31/2018  . Postpartum care following vaginal delivery 3/24 07/31/2018  . Perineal laceration, second degree 07/31/2018  . VBAC, delivered 07/31/2018    Augmentation: AROM, Pitocin  Complications: None  Date of Delivery: 07/31/2018 Delivered By: D. Renae Fickle, CNM  Delivery Type: spontaneous vaginal delivery/ VBAC Anesthesia: epidural Placenta: sponatneous Laceration: 2nd degree  Episiotomy: none  Newborn Data: Live born female  Birth Weight: 8 lb 11.9 oz (3965 g) APGAR: 8, 9  Newborn Delivery   Birth date/time:  07/31/2018 14:29:00 Delivery type:  VBAC, Spontaneous        Hospital/Postpartum Course  (Vaginal Delivery): Pt. Admitted for elective IOL at 39 weeks.  She progressed with AROM and Pitocin.  See notes for details. Patient had an uncomplicated postpartum course.  By time of discharge on PPD#1, her pain was controlled on oral pain medications; she had appropriate lochia and was ambulating, voiding without difficulty and tolerating regular diet.  She was deemed stable for discharge to home.     Labs: CBC Latest Ref Rng & Units 07/31/2018  WBC  4.0 - 10.5 K/uL 9.2  Hemoglobin 12.0 - 15.0 g/dL 72.0  Hematocrit 94.7 - 46.0 % 37.8  Platelets 150 - 400 K/uL 175   Conflict (See Lab Report): A POS/A POS Performed at [facility], [address], [place], [place] [address]   Physical exam:  BP 102/65   Pulse 82   Temp 98.1 F (36.7 C) (Oral)   Resp 18   Ht 5\' 5"  (1.651 m)   Wt 81.6 kg   SpO2 99%   Breastfeeding Unknown   BMI 29.95 kg/m  General: alert and no distress Pulm: normal respiratory effort Lochia: appropriate Abdomen: soft, NT Uterine Fundus: firm, below umbilicus Perineum: healing well, no significant erythema, no significant edema Extremities: No evidence of DVT seen on physical exam. No lower extremity edema.   Disposition: stable, discharge to home Baby Feeding: breast milk Baby Disposition: home with mom  Contraception: not discussed  Rh Immune globulin given: N/A Rubella vaccine given: N/A Tdap vaccine given in AP or PP setting: UTD Flu vaccine given in AP or PP setting: UTD   Plan:  Anita Reyes was discharged to home in good condition. Follow-up appointment at [facility] in 6 weeks.  Discharge Instructions: Per After Visit Summary. Refer to After Visit Summary and [facility] discharge booklet  Activity: Advance as tolerated. Pelvic rest for 6 weeks.   Diet: Regular, Heart Healthy Discharge Medications: Allergies as of 08/01/2018   No Known Allergies     Medication List    TAKE these medications   ibuprofen 600 MG tablet Commonly known as:  ADVIL,MOTRIN Take 1 tablet (600 mg total) by  mouth every 6 (six) hours.   prenatal multivitamin Tabs tablet Take 1 tablet by mouth daily at 12 noon.   simethicone 80 MG chewable tablet Commonly known as:  MYLICON Chew 1 tablet (80 mg total) by mouth as needed for flatulence.            Discharge Care Instructions  (From admission, onward)         Start     Ordered   08/01/18 0000  Discharge wound care:    Comments:   Warm water sitz baths 2-3 times per day PRN   08/01/18 1242         Outpatient follow up:  Follow-up Information    [name], CNM. Schedule an appointment as soon as possible for a visit in 6 week(s).   Specialty:  Obstetrics and Gynecology Why:  Postpartum visit  Contact information: [address] [place] [address] [phone]           Signed:  Carlean Jews, MSN, CNM [facility]

## 2018-08-01 NOTE — Progress Notes (Addendum)
PPD #1, VBAC, 2nd degree repair, baby boy "Luke"  S:  Reports feeling well; tired; desires early discharge home  C/o gas pain, has not had a bowel movement   Request [facility] all purpose  Nipple cream rx              Tolerating po/ No nausea or vomiting / Denies dizziness or SOB             Bleeding is light             Pain controlled with Motrin and Tylenol              Up ad lib / ambulatory / voiding QS  Newborn breast feeding - going well   / Circumcision - deferred for pediatric urology consult   O:               VS: BP 102/65   Pulse 82   Temp 98.1 F (36.7 C) (Oral)   Resp 18   Ht 5\' 5"  (1.651 m)   Wt 81.6 kg   SpO2 99%   Breastfeeding Unknown   BMI 29.95 kg/m    LABS:              Recent Labs    07/31/18 0905  WBC 9.2  HGB 12.5  PLT 175               Blood type: --/--/A POS, A POS Performed at [facility], [address] [place], [place] [address]  [phone])  Rubella: Immune (09/06 0000)                     I&O: Intake/Output      03/24 0701 - 03/25 0700 03/25 0701 - 03/26 0700   Urine (mL/kg/hr) 150    Blood 247    Total Output 397    Net -397                       Physical Exam:             Alert and oriented X3  Lungs: Clear and unlabored  Heart: regular rate and rhythm / no murmurs  Abdomen: soft, non-tender, non-distended              Fundus: firm, non-tender, U-E  Perineum: well approximated 2nd degree, minimal swelling, no erythema or ecchymosis; small external hemorrhoid not thrombosed  Lochia: small, no clots   Extremities: no edema, no calf pain or tenderness    A: PPD # 1, VBAC  2nd degree repair  External hemorrhoid     Doing well - stable status  P: Routine post partum orders  Discharge home today  WOB discharge book and instructions and warning s/s reviewed   Warm water sitz baths 2-3 times per day PRN   Continue using Tucks pads  Encouraged warm liquids and ambulation for flatus pain  Simethicone PRN   F/u with  CNM in 6 weeks for PP visit   Carlean Jews, MSN, CNM [facility]

## 2018-08-01 NOTE — Anesthesia Postprocedure Evaluation (Signed)
Anesthesia Post Note  Patient: Anita Reyes  Procedure(s) Performed: AN AD HOC LABOR EPIDURAL     Patient location during evaluation: Mother Baby Anesthesia Type: Epidural Level of consciousness: awake and alert and oriented Pain management: satisfactory to patient Vital Signs Assessment: post-procedure vital signs reviewed and stable Respiratory status: respiratory function stable Cardiovascular status: stable Postop Assessment: no headache, no backache, epidural receding, patient able to bend at knees, no signs of nausea or vomiting and adequate PO intake Anesthetic complications: no    Last Vitals:  Vitals:   07/31/18 2140 08/01/18 0546  BP: (!) 112/59 102/65  Pulse: 92 82  Resp: 18 18  Temp: 36.6 C 36.7 C  SpO2: 97% 99%    Last Pain:  Vitals:   08/01/18 0546  TempSrc: Oral  PainSc:    Pain Goal:                   [name]

## 2018-08-06 ENCOUNTER — Inpatient Hospital Stay (HOSPITAL_COMMUNITY)
Admission: AD | Admit: 2018-08-06 | Discharge: 2018-08-08 | DRG: 776 | Disposition: A | Discharge status: Home / Self Care | Payer: BLUE CROSS/BLUE SHIELD | Attending: Obstetrics & Gynecology | Admitting: Obstetrics & Gynecology

## 2018-08-06 ENCOUNTER — Encounter (HOSPITAL_COMMUNITY): Payer: Self-pay | Admitting: Obstetrics & Gynecology

## 2018-08-06 ENCOUNTER — Inpatient Hospital Stay (HOSPITAL_COMMUNITY): Payer: BLUE CROSS/BLUE SHIELD

## 2018-08-06 DIAGNOSIS — R748 Abnormal levels of other serum enzymes: Secondary | ICD-10-CM

## 2018-08-06 DIAGNOSIS — O1495 Unspecified pre-eclampsia, complicating the puerperium: Principal | ICD-10-CM | POA: Diagnosis present

## 2018-08-06 DIAGNOSIS — I10 Essential (primary) hypertension: Secondary | ICD-10-CM | POA: Diagnosis not present

## 2018-08-06 DIAGNOSIS — O1405 Mild to moderate pre-eclampsia, complicating the puerperium: Secondary | ICD-10-CM | POA: Diagnosis not present

## 2018-08-06 DIAGNOSIS — O9089 Other complications of the puerperium, not elsewhere classified: Secondary | ICD-10-CM | POA: Diagnosis not present

## 2018-08-06 DIAGNOSIS — R1013 Epigastric pain: Secondary | ICD-10-CM

## 2018-08-06 DIAGNOSIS — R609 Edema, unspecified: Secondary | ICD-10-CM | POA: Diagnosis not present

## 2018-08-06 DIAGNOSIS — O1205 Gestational edema, complicating the puerperium: Secondary | ICD-10-CM | POA: Diagnosis not present

## 2018-08-06 HISTORY — DX: Abnormal levels of other serum enzymes: R74.8

## 2018-08-06 HISTORY — DX: Epigastric pain: R10.13

## 2018-08-06 MED ORDER — MAGNESIUM SULFATE BOLUS VIA INFUSION
4.0000 g | Freq: Once | INTRAVENOUS | Status: AC
  Administered 2018-08-06: 4 g via INTRAVENOUS
  Filled 2018-08-06: qty 500

## 2018-08-06 MED ORDER — MAGNESIUM SULFATE 40 G IN LACTATED RINGERS - SIMPLE
1.0000 g/h | INTRAVENOUS | Status: DC
  Administered 2018-08-06: 2 g/h via INTRAVENOUS
  Filled 2018-08-06: qty 500

## 2018-08-06 MED ORDER — FAMOTIDINE 20 MG PO TABS
20.0000 mg | ORAL_TABLET | Freq: Every day | ORAL | Status: DC
  Administered 2018-08-06 – 2018-08-08 (×3): 20 mg via ORAL
  Filled 2018-08-06 (×3): qty 1

## 2018-08-06 MED ORDER — LACTATED RINGERS IV SOLN
INTRAVENOUS | Status: DC
  Administered 2018-08-06: 21:00:00 via INTRAVENOUS

## 2018-08-06 NOTE — H&P (Signed)
Anita Reyes is an 38 y.o. female.   Chief Complaint: Epigastric pain, postpartum day 6. Elevated liver enzymes in office today.   VBAC 07/31/18, discharged on 3/25 with no complications. 4 kids, 1st C/s, then VBAC x 3. Same FOB.  No h/o preeclampsia. No gall stones hx.  Called office with epigastric pain and a lot of discomfort with movements,stated since 3/28-29 and progressed over the weekend, so called office. Some nausea, no diarrhea/ fever/ chills/ heartburn.  Notes LE swelling since discharge. No SOB/ CP/ HA/ vision changes   Breast feeding.   Past Medical History:  Diagnosis Date  . Elevated liver enzymes 08/06/2018  . Epigastric pain 08/06/2018    Past Surgical History:  Procedure Laterality Date  . CESAREAN SECTION    . DILATION AND CURETTAGE OF UTERUS      History reviewed. No pertinent family history. Social History:  reports that she has never smoked. She has never used smokeless tobacco. No history on file for alcohol and drug.  Allergies: No Known Allergies  Medications Prior to Admission  Medication Sig Dispense Refill  . ibuprofen (ADVIL,MOTRIN) 600 MG tablet Take 1 tablet (600 mg total) by mouth every 6 (six) hours. 30 tablet 0  . Prenatal Vit-Fe Fumarate-FA (PRENATAL MULTIVITAMIN) TABS tablet Take 1 tablet by mouth daily at 12 noon.    . simethicone (MYLICON) 80 MG chewable tablet Chew 1 tablet (80 mg total) by mouth as needed for flatulence. 30 tablet 0    No results found for this or any previous visit (from the past 48 hour(s)). US Abdomen Limited Ruq  Result Date: 08/06/2018 CLINICAL DATA:  Epigastric pain.  Hypertension. EXAM: ULTRASOUND ABDOMEN LIMITED RIGHT UPPER QUADRANT COMPARISON:  None. FINDINGS: Gallbladder: No gallstones or wall thickening visualized. No sonographic Murphy sign noted by sonographer. Common bile duct: Diameter: 2 mm. Liver: No focal lesion identified. Within normal limits in parenchymal echogenicity. Portal vein is patent on color  Doppler imaging with normal direction of blood flow towards the liver. IMPRESSION: Normal right upper quadrant ultrasound. Electronically Signed   By: Elberta Fortis M.D.   On: 08/06/2018 21:07   ROSabove   Blood pressure 120/70, pulse 71, temperature 98.4 F (36.9 C), temperature source Oral, resp. rate 17, SpO2 100 %, unknown if currently breastfeeding. Physical Exam  Physical exam:  BP (!) 111/59   Pulse 60   Temp 98.4 F (36.9 C) (Oral)   Resp (P) 16   SpO2 97%  Patient Vitals for the past 24 hrs:  BP Temp Temp src Pulse Resp SpO2  08/07/18 0005 - - - - - 97 %  08/07/18 0001 (!) 111/59 - - 60 (P) 16 -  08/06/18 2305 - - - - - 100 %  08/06/18 2301 120/70 - - 71 17 -  08/06/18 2244 - 98.4 F (36.9 C) Oral - - -  08/06/18 2205 - - - - - 99 %  08/06/18 2202 119/70 - - 71 18 -  08/06/18 2200 - - - - - 97 %  08/06/18 2155 - - - - - 98 %  08/06/18 2152 118/66 - - 72 16 -  08/06/18 2118 130/77 - - (!) 59 19 100 %  08/06/18 1905 127/85 98.2 F (36.8 C) Oral 63 20 99 %    A&O x 3, no acute distress. Pleasant Neuro +DTR +2 /+2 HEENT neg, no thyromegaly Lungs CTA bilat CV RRR, S1S2 normal Abdo soft, non tender, non acute Extr no edema/ tenderness Pelvic  deferred   Assessment/Plan 38 yo G4P4, PPD#6, admitted for isolated epigastric pain and elevated LFTs, nl BP range Preelampsia- atypical, repeat CBC, CMP tomorrow. Magnesium 24 hrs.  R/o gall stones- plan RUQ sono   Robley Fries, MD 08/06/2018, 11:23 PM

## 2018-08-07 ENCOUNTER — Other Ambulatory Visit: Payer: Self-pay

## 2018-08-07 LAB — COMPREHENSIVE METABOLIC PANEL
ALK PHOS: 87 U/L (ref 38–126)
ALT: 207 U/L — ABNORMAL HIGH (ref 0–44)
ALT: 193 U/L — ABNORMAL HIGH (ref 0–44)
AST: 49 U/L — ABNORMAL HIGH (ref 15–41)
AST: 44 U/L — ABNORMAL HIGH (ref 15–41)
Albumin: 2.6 g/dL — ABNORMAL LOW (ref 3.5–5.0)
Albumin: 2.5 g/dL — ABNORMAL LOW (ref 3.5–5.0)
Alkaline Phosphatase: 89 U/L (ref 38–126)
Anion gap: 8 (ref 5–15)
Anion gap: 7 (ref 5–15)
BUN: 12 mg/dL (ref 6–20)
BUN: 12 mg/dL (ref 6–20)
CALCIUM: 7.1 mg/dL — AB (ref 8.9–10.3)
CO2: 27 mmol/L (ref 22–32)
CO2: 29 mmol/L (ref 22–32)
CREATININE: 0.73 mg/dL (ref 0.44–1.00)
Calcium: 6.8 mg/dL — ABNORMAL LOW (ref 8.9–10.3)
Chloride: 105 mmol/L (ref 98–111)
Chloride: 104 mmol/L (ref 98–111)
Creatinine, Ser: 0.92 mg/dL (ref 0.44–1.00)
GFR calc Af Amer: >60 mL/min (ref >60)
GFR calc Af Amer: >60 mL/min (ref >60)
GFR calc non Af Amer: >60 mL/min (ref >60)
GFR calc non Af Amer: >60 mL/min (ref >60)
Glucose, Bld: 90 mg/dL (ref 70–99)
Glucose, Bld: 73 mg/dL (ref 70–99)
Potassium: 3.8 mmol/L (ref 3.5–5.1)
Potassium: 3.9 mmol/L (ref 3.5–5.1)
Sodium: 140 mmol/L (ref 135–145)
Sodium: 140 mmol/L (ref 135–145)
Total Bilirubin: 0.2 mg/dL — ABNORMAL LOW (ref 0.3–1.2)
Total Bilirubin: 0.3 mg/dL (ref 0.3–1.2)
Total Protein: 5.6 g/dL — ABNORMAL LOW (ref 6.5–8.1)
Total Protein: 5.5 g/dL — ABNORMAL LOW (ref 6.5–8.1)

## 2018-08-07 LAB — CBC
HCT: 38.6 % (ref 36.0–46.0)
Hemoglobin: 12.1 g/dL (ref 12.0–15.0)
MCH: 27.9 pg (ref 26.0–34.0)
MCHC: 31.3 g/dL (ref 30.0–36.0)
MCV: 89.1 fL (ref 80.0–100.0)
PLATELETS: 209 10*3/uL (ref 150–400)
RBC: 4.33 MIL/uL (ref 3.87–5.11)
RDW: 14.2 % (ref 11.5–15.5)
WBC: 6.8 10*3/uL (ref 4.0–10.5)
nRBC: 0 % (ref 0.0–0.2)

## 2018-08-07 MED ORDER — ACETAMINOPHEN 500 MG PO TABS
1000.0000 mg | ORAL_TABLET | Freq: Four times a day (QID) | ORAL | Status: DC | PRN
  Administered 2018-08-07: 1000 mg via ORAL
  Filled 2018-08-07: qty 2

## 2018-08-07 MED ORDER — OXYCODONE HCL 5 MG PO TABS: 5.0000 mg | ORAL_TABLET | ORAL | Status: DC | PRN

## 2018-08-07 MED ORDER — SIMETHICONE 80 MG PO CHEW
80.0000 mg | CHEWABLE_TABLET | Freq: Four times a day (QID) | ORAL | Status: DC | PRN
  Administered 2018-08-07 (×2): 80 mg via ORAL
  Filled 2018-08-07 (×2): qty 1

## 2018-08-07 MED ORDER — HYDROCHLOROTHIAZIDE 12.5 MG PO CAPS
12.5000 mg | ORAL_CAPSULE | Freq: Every day | ORAL | Status: DC
  Administered 2018-08-07: 12.5 mg via ORAL
  Filled 2018-08-07: qty 1

## 2018-08-07 MED ORDER — SODIUM CHLORIDE 0.9% FLUSH
3.0000 mL | Freq: Two times a day (BID) | INTRAVENOUS | Status: DC
  Administered 2018-08-07: 3 mL via INTRAVENOUS

## 2018-08-07 NOTE — Progress Notes (Addendum)
Patient ID: Anita Reyes, female   DOB: [birth year], 38 y.o.   MRN: [id number] Pt admitted withy epigastric pain, elevated LFTs, presumed PP Preeclampsia despite normal range BPs. BP stable overnight, no elevated BPs in house. Epigastric pain continues. RUQ sono- normal GB, nl liver, no hematoma or changes noted.  Now HA likely from magnesium, reduce rate to 1 gm /hr now and stop at noon since she never had HA at presentation and is relatively at low risk of seizure.  D/D- Atypical PEC only affecting liver v/s any other etiology. Low risk for HepC, no IVDA and monogamous. HBsAg neg in preg, no international travel to suspect HepA.  No gall stones. No fatty liver on sono and no liver hematoma to explain pain.  Oxycodone IR for pain.  May consider out pt f/up if 6 pm labs this evening remain stable/ improved.   V.Kashlyn Salinas ,MD

## 2018-08-07 NOTE — Progress Notes (Signed)
Postpartum day # 7, Postpartum readmission Day #1 for elevated liver enzymes, likely atypical pre-eclampsia  S:  Reports feeling not great on the magnesium.  Headache started after being on Magnesium and states it is on either side of her head and in the occipital region.  Denies visual disturbances.  Reports RUQ/epigastric pain with movement, but states she is okay at rest.             Tolerating po/ No nausea or vomiting / Denies dizziness or SOB             Bleeding is spotting             Up ad lib / ambulatory / voiding QS  Newborn breast feeding - pt. Reports they are going to call their pediatrician to have him evaluated for jaundice  / Circumcision - outpatient circ; Dr. Juliene Pina to evaluate for [facility] circ  O:               VS: BP 121/72 (BP Location: Right Arm)   Pulse 68   Temp 98.3 F (36.8 C) (Oral)   Resp 15   Ht 5\' 5"  (1.651 m)   Wt 75.6 kg   SpO2 100%   BMI 27.75 kg/m  08/07/18 0600  -  68  -  15  121/72  Left side lying/tilt  -  -  - CH   08/07/18 0500  -  69  -  -  121/74  -  -  -  - CH   08/07/18 0500  -  -  -  -  -  -  -  -  75.6 kg CS   08/07/18 0405  -  -  -  -  -  -  100 %  -  - CS   08/07/18 0401  -  65  -  -  125/83  Lying  -  -  - CS   08/07/18 0300  -  73  -  -  114/76  -  97 %  -  - CH   08/07/18 0300  98.3 F (36.8 C)  -  -  17  -  Lying  -  -  - CS   08/07/18 0201  -  61  -  16  118/76  Lying  -  -  - CH   08/07/18 0200  -  -  -  -  -  -  100 %  -  - [facility]   08/07/18 0115  -  59Abnormal   -  -  127/78  -  100 %  -  - CH   08/07/18 0005  -  -  -  -  -  -  97 %  -  - CH   08/07/18 0001  -  60  -  16  111/59Abnormal   -  -  -  - CH   08/06/18 2305  -  -  -  -  -  -  100 %  -  - CH   08/06/18 2301  -  71  -  17  120/70  Sitting  -  -  - CH   08/06/18 2244  98.4 F (36.9 C)  -  -  -  -  -  -  -  - CH      LABS:      WBC 4.0 - 10.5 K/uL 6.8  9.2   RBC 3.87 -  5.11 MIL/uL 4.33  4.28   Hemoglobin 12.0 - 15.0 g/dL 83.4  19.6   HCT 22.2 - 46.0 % 38.6  37.8   MCV  80.0 - 100.0 fL 89.1  88.3   MCH 26.0 - 34.0 pg 27.9  29.2   MCHC 30.0 - 36.0 g/dL 97.9  89.2   RDW 11.9 - 15.5 % 14.2  14.5   Platelets 150 - 400 K/uL 209  175   nRBC 0.0 - 0.2 % 0.0          Sodium 135 - 145 mmol/L 140   Potassium 3.5 - 5.1 mmol/L 3.8   Chloride 98 - 111 mmol/L 105   CO2 22 - 32 mmol/L 27   Glucose, Bld 70 - 99 mg/dL 90   BUN 6 - 20 mg/dL 12   Creatinine, Ser 4.17 - 1.00 mg/dL 4.08   Calcium 8.9 - 14.4 mg/dL 8.1EHU    Total Protein 6.5 - 8.1 g/dL 3.1SHF    Albumin 3.5 - 5.0 g/dL 0.2OVZ    AST 15 - 41 U/L 49High    ALT 0 - 44 U/L 207High    Alkaline Phosphatase 38 - 126 U/L 87   Total Bilirubin 0.3 - 1.2 mg/dL 8.5YIF    GFR calc non Af Amer >60 mL/min >60   GFR calc Af Amer >60 mL/min >60   Anion gap 5 - 15 8                  Blood type: --/--/A POS, A POS Performed at [facility], [address], [place], [place] [address]  [phone])  Rubella: Immune (09/06 0000)                     I&O: Intake/Output      03/30 0701 - 03/31 0700 03/31 0701 - 04/01 0700   P.O. 1800    I.V. (mL/kg) 514.3 (6.8)    Total Intake(mL/kg) 2314.3 (30.6)    Urine (mL/kg/hr) 4925    Total Output 4925    Net -2610.7         Total output since admission: -3170  Weight from 3/30 at the office: 171 pounds Weight from 3/31: 166.75 pounds               Physical Exam:             Alert and oriented X3  Lungs: Clear and unlabored  Heart: regular rate and rhythm / no murmurs  Abdomen: soft, non-distended, RUQ/epigastric tenderness   Extremities: non-pitting bilateral lower extremity edema, no calf pain or tenderness; non-pitting upper extremity edema; +2 DTRS bilaterally, no clonus bilaterally     A: Postpartum day # 7, Postpartum readmission Day #1 for elevated liver enzymes, likely atypical pre-eclampsia  Dependent edema  P: Routine post partum orders  BPs WNL  HCTZ 12.5mg  x 5 days  Continue strict I&O and dialy weights  Received Tylenol 1000mg  x 1 dose; will  switch to Oxycodone IR 5mg  PO every 4 hrs PRN   D/C Magnesium sulfate at 12pm  See Dr. Camillia Herter note for further details   Carlean Jews, MSN, CNM [facility]

## 2018-08-07 NOTE — Lactation Note (Signed)
Lactation Consultation Note  Patient Name: TONETTE BUTCH CBJSE'G Date: 08/07/2018   Patient reports infant is here with her and they are breastfeeding without difficulty. Urged mom to call lactation as needed.  Maternal Data    Feeding    LATCH Score                   Interventions    Lactation Tools Discussed/Used     Consult Status      Jaime Grizzell Michaelle Copas 08/07/2018, 4:27 PM

## 2018-08-07 NOTE — Progress Notes (Signed)
Interval note:  S: Pt. C/o mild dull HA since Magnesium was started, but states she does not feel like she needs medications for it.  Had some blurry vision while Mag was infusing, but has resolved. Reports epigastric pain and RUQ pain have resolved. Breastfeeding is going well. Urine output per patient is going well.   O:  08/07/18 1914  98.4 F (36.9 C)  68  -  17  127/55Abnormal   Sitting  100 %  -  - TH   08/07/18 1833  -  73  -  -  130/76  -  -  -  - PL   08/07/18 1627  98.3 F (36.8 C)  73  -  15  112/68  -  100 %  -  - LS   08/07/18 1325  -  -  -  18  -  -  -  -  - PL   08/07/18 1230  -  -  -  17  -  -  -  -  - PL   08/07/18 1214  98.1 F (36.7 C)  69  -  18  106/76  Semi-fowlers  100 %  -  - LS   08/07/18 1135  -  -  -  18  -  -  -  -  - PL   08/07/18 0916  -  -  -  -  -  -  -  Room Air  - PL   08/07/18 0916  98.3 F (36.8 C)  69  -  18  112/81  Semi-fowlers  100 %  -  - TL   08/07/18 0915  -  -  -  -  -  -  100 %  -  - LS   08/07/18 0830  -  -  -  17  -  -  -  -  - PL   08/07/18 0730  -  -  -  16  -  -  -  -  - PL   08/07/18 0600  -  68  -  15  121/72  Left side lying/tilt  -  -  - CH   08/07/18 0500  -  69  -  -  121/74  -  -  -  - CH   08/07/18 0500  -  -  -  -  -  -  -  -  75.6 kg CS   08/07/18 0405  -  -  -  -  -  -  100 %  -  - CS   08/07/18 0401  -  65  -  -  125/83  Lying  -  -  - CS   08/07/18 0300  -  73  -  -  114/76  -  97 %  -  - CH   08/07/18 0300  98.3 F (36.8 C)  -  -  17  -  Lying  -  -  - CS   08/07/18 0201  -  61  -  16  118/76  Lying  -  -  - CH   08/07/18 0200  -  -  -  -  -  -  100 %  -  - [facility]   08/07/18 0115  -  59Abnormal   -  -  127/78  -  100 %  -  - CH   08/07/18 0005  -  -  -  -  -  -  97 %  -  - [facility]   08/07/18 0001  -  60  -  16  111/59Abnormal   -  -  -  - CH   08/06/18 2305  -  -  -  -  -  -  100 %  -  - CH   08/06/18 2301  -  71  -  17  120/70  Sitting  -  -  - CH   08/06/18 2244  98.4 F (36.9 C)  -  -  -  -  -  -  -  - [facility]   08/06/18 2205   -  -  -  -  -  -  99 %  -  - CH   08/06/18 2202  -  71  -  18  119/70  High-fowlers  -  -  - CH   08/06/18 2200  -  -  -  -  -  -  97 %  -  - CH   08/06/18 2155  -  -  -  -  -  -  98 %  -  - [facility]   08/06/18 2152  -  72  -  16  118/66  High-fowlers  -  -  - [facility]   08/06/18 2118  -  59Abnormal   -  19  130/77  -  100 %  -  - [facility]   08/06/18 1905  98.2 F (36.8 C)  63  -  20  127/85  Semi-fowlers  99 %  -  - LS       Results for orders placed or performed during the hospital encounter of 08/06/18 (from the past 24 hour(s))  Comprehensive metabolic panel     Status: Abnormal   Collection Time: 08/07/18  5:53 AM  Result Value Ref Range   Sodium 140 135 - 145 mmol/L   Potassium 3.8 3.5 - 5.1 mmol/L   Chloride 105 98 - 111 mmol/L   CO2 27 22 - 32 mmol/L   Glucose, Bld 90 70 - 99 mg/dL   BUN 12 6 - 20 mg/dL   Creatinine, Ser 1.61 0.44 - 1.00 mg/dL   Calcium 7.1 (L) 8.9 - 10.3 mg/dL   Total Protein 5.6 (L) 6.5 - 8.1 g/dL   Albumin 2.6 (L) 3.5 - 5.0 g/dL   AST 49 (H) 15 - 41 U/L   ALT 207 (H) 0 - 44 U/L   Alkaline Phosphatase 87 38 - 126 U/L   Total Bilirubin 0.2 (L) 0.3 - 1.2 mg/dL   GFR calc non Af Amer >60 >60 mL/min   GFR calc Af Amer >60 >60 mL/min   Anion gap 8 5 - 15  CBC     Status: None   Collection Time: 08/07/18  5:53 AM  Result Value Ref Range   WBC 6.8 4.0 - 10.5 K/uL   RBC 4.33 3.87 - 5.11 MIL/uL   Hemoglobin 12.1 12.0 - 15.0 g/dL   HCT 09.6 04.5 - 40.9 %   MCV 89.1 80.0 - 100.0 fL   MCH 27.9 26.0 - 34.0 pg   MCHC 31.3 30.0 - 36.0 g/dL   RDW 81.1 91.4 - 78.2 %   Platelets 209 150 - 400 K/uL   nRBC 0.0 0.0 - 0.2 %  Comprehensive metabolic panel     Status: Abnormal   Collection Time: 08/07/18  5:47 PM  Result Value Ref Range   Sodium 140 135 -  145 mmol/L   Potassium 3.9 3.5 - 5.1 mmol/L   Chloride 104 98 - 111 mmol/L   CO2 29 22 - 32 mmol/L   Glucose, Bld 73 70 - 99 mg/dL   BUN 12 6 - 20 mg/dL   Creatinine, Ser 5.85 0.44 - 1.00 mg/dL   Calcium 6.8 (L) 8.9 - 10.3  mg/dL   Total Protein 5.5 (L) 6.5 - 8.1 g/dL   Albumin 2.5 (L) 3.5 - 5.0 g/dL   AST 44 (H) 15 - 41 U/L   ALT 193 (H) 0 - 44 U/L   Alkaline Phosphatase 89 38 - 126 U/L   Total Bilirubin 0.3 0.3 - 1.2 mg/dL   GFR calc non Af Amer >60 >60 mL/min   GFR calc Af Amer >60 >60 mL/min   Anion gap 7 5 - 15   P/C Ratio: 0.33 in office on 3/30  Intake/Output Summary (Last 24 hours) at 08/07/2018 2102 Last data filed at 08/07/2018 1900 Gross per 24 hour  Intake 4534 ml  Output 9525 ml  Net -4991 ml    A/P: Postpartum atypical pre-eclampsia - normotensive with elevated LFTS    - Serum creatinine rising: d/c HCTZ    - Continue Strict I&O and daily weights    - Repeat CMP in AM    - Anticipate d/c home in AM: I informed patient of lab results and plan via telephone   Consult for plan of care: Dr. Corky Crafts, CNM

## 2018-08-08 ENCOUNTER — Encounter (HOSPITAL_COMMUNITY): Payer: Self-pay | Admitting: *Deleted

## 2018-08-08 LAB — COMPREHENSIVE METABOLIC PANEL
ALT: 152 U/L — ABNORMAL HIGH (ref 0–44)
AST: 29 U/L (ref 15–41)
Albumin: 2.4 g/dL — ABNORMAL LOW (ref 3.5–5.0)
Alkaline Phosphatase: 85 U/L (ref 38–126)
Anion gap: 9 (ref 5–15)
BUN: 12 mg/dL (ref 6–20)
CO2: 24 mmol/L (ref 22–32)
Calcium: 7.3 mg/dL — ABNORMAL LOW (ref 8.9–10.3)
Chloride: 104 mmol/L (ref 98–111)
Creatinine, Ser: 0.81 mg/dL (ref 0.44–1.00)
GFR calc Af Amer: >60 mL/min (ref >60)
GFR calc non Af Amer: >60 mL/min (ref >60)
Glucose, Bld: 84 mg/dL (ref 70–99)
Potassium: 4.1 mmol/L (ref 3.5–5.1)
Sodium: 137 mmol/L (ref 135–145)
Total Bilirubin: 0.4 mg/dL (ref 0.3–1.2)
Total Protein: 5.5 g/dL — ABNORMAL LOW (ref 6.5–8.1)

## 2018-08-08 LAB — CBC
HCT: 40.6 % (ref 36.0–46.0)
Hemoglobin: 13 g/dL (ref 12.0–15.0)
MCH: 28.9 pg (ref 26.0–34.0)
MCHC: 32 g/dL (ref 30.0–36.0)
MCV: 90.2 fL (ref 80.0–100.0)
Platelets: 251 10*3/uL (ref 150–400)
RBC: 4.5 MIL/uL (ref 3.87–5.11)
RDW: 14.4 % (ref 11.5–15.5)
WBC: 6.8 10*3/uL (ref 4.0–10.5)
nRBC: 0 % (ref 0.0–0.2)

## 2018-08-08 NOTE — Discharge Summary (Signed)
  OB Discharge Summary  Patient Name: Anita Reyes DOB: 04/26/1981 MRN: [id number]  Date of admission: 08/06/2018  Date of discharge: 08/08/2018  Admitting diagnosis: pp, pre- e Intrauterine pregnancy: Unknown     Secondary diagnosis:Principal Problem:   Elevated liver enzymes Active Problems:   Epigastric pain  Additional problems:none     Discharge diagnosis:  Patient Active Problem List   Diagnosis Date Noted  . Epigastric pain 08/06/2018  . Elevated liver enzymes 08/06/2018  . Labor and delivery, indication for care 07/31/2018  . Postpartum care following vaginal delivery 3/24 07/31/2018  . Perineal laceration, second degree 07/31/2018  . VBAC, delivered 07/31/2018     Hospital course:  Patient readmitted on PP day 7 for epigastric pain, nausea, and increased swelling. Labs showed elevated liver enzymes, BP normal. Patient was given Mag Sulfate IV therapy x 12 hours with following good diuresis and LFT's noted to trend down over next 24 hours.  She was discharged home on hospital day 2 in stable condition.   Physical exam  Vitals:   08/07/18 2323 08/08/18 0404 08/08/18 0426 08/08/18 0801  BP: 122/71 123/71  (!) 119/92  Pulse: 62 60  68  Resp: 16   16  Temp: 98.4 F (36.9 C) 98.1 F (36.7 C)  97.8 F (36.6 C)  TempSrc: Oral Oral  Oral  SpO2: 99% 99%  100%  Weight:   73.5 kg   Height:       General: alert, cooperative and no distress Lochia: appropriate Uterine Fundus: firm Incision: Healing well with no significant drainage DVT Evaluation: No cords or calf tenderness. No significant calf/ankle edema. Labs: Lab Results  Component Value Date   WBC 6.8 08/08/2018   HGB 13.0 08/08/2018   HCT 40.6 08/08/2018   MCV 90.2 08/08/2018   PLT 251 08/08/2018   Hepatic Function Latest Ref Rng & Units 08/08/2018 08/07/2018 08/07/2018  Total Protein 6.5 - 8.1 g/dL 5.5(L) 5.5(L) 5.6(L)  Albumin 3.5 - 5.0 g/dL 2.4(L) 2.5(L) 2.6(L)  AST 15 - 41 U/L 29 44(H) 49(H)  ALT 0  - 44 U/L 152(H) 193(H) 207(H)  Alk Phosphatase 38 - 126 U/L 85 89 87  Total Bilirubin 0.3 - 1.2 mg/dL 0.4 0.3 0.2(L)   Discharge instruction: per After Visit Summary   After Visit Meds:  Allergies as of 08/08/2018   No Known Allergies     Medication List    TAKE these medications   docusate sodium 100 MG capsule Commonly known as:  COLACE Take 100 mg by mouth 2 (two) times daily as needed for mild constipation.   ibuprofen 600 MG tablet Commonly known as:  ADVIL,MOTRIN Take 1 tablet (600 mg total) by mouth every 6 (six) hours.   prenatal multivitamin Tabs tablet Take 1 tablet by mouth daily at 12 noon.   simethicone 80 MG chewable tablet Commonly known as:  MYLICON Chew 1 tablet (80 mg total) by mouth as needed for flatulence.       Diet: routine diet  Activity: Advance as tolerated. Pelvic rest for 6 weeks.   Follow up: Follow-up Information    Juliene Pina, CNM. Schedule an appointment as soon as possible for a visit in 4 week(s).   Specialty:  Obstetrics and Gynecology Why:  BP check tomorrow at [facility] during circumcision appointment Contact information: [place] [address] [phone]             Signed: Otilio Carpen, MSN 08/08/2018, 9:33 AM

## 2018-08-08 NOTE — Progress Notes (Signed)
Teaching  Complete

## 2018-08-08 NOTE — Discharge Instructions (Signed)
For swelling: nettle and dandelion tinctures (follw directions on bottles), or tea form 3-4 servings / day, maintain hydration 8-10 cups water / day minimal (can find at health food stores - [employer], [employer], [employer])

## 2018-08-08 NOTE — Progress Notes (Signed)
Patient ID: Anita Reyes, female   DOB: 1981/01/01, 38 y.o.   MRN: [id number] Postpartum day # 8, Postpartum readmission Day #2 for elevated liver enzymes, likely atypical pre-eclampsia  S: Feels well, edema much improved, no HA or RUQ pain.   O:  Vitals:   08/07/18 2323 08/08/18 0404 08/08/18 0426 08/08/18 0801  BP: 122/71 123/71  (!) 119/92  Pulse: 62 60  68  Resp: 16   16  Temp: 98.4 F (36.9 C) 98.1 F (36.7 C)  97.8 F (36.6 C)  TempSrc: Oral Oral  Oral  SpO2: 99% 99%  100%  Weight:   73.5 kg   Height:       Filed Weights   08/07/18 0500 08/08/18 0426  Weight: 75.6 kg 73.5 kg    Intake/Output Summary (Last 24 hours) at 08/08/2018 0859 Last data filed at 08/08/2018 0747 Gross per 24 hour  Intake 2889.7 ml  Output 6550 ml  Net -3660.3 ml   Physical Exam:             Alert and oriented X3             Lungs: Clear and unlabored             Heart: regular rate and rhythm / no murmurs             Abdomen: soft, non-distended, RUQ/epigastric tenderness              Extremities: no edema, no calf pain or tenderness; +2 DTRS bilaterally, no clonus bilaterally    A:         Postpartum day # 8, Postpartum readmission Day #2 for elevated liver enzymes, likely atypical pre-eclampsia             P:         Routine post partum orders             BPs WNL             edema resolved, LFT trending down             DC home today w/ instructions  F/U at [facility] in 4 weeks and PRN Will see patient at [facility] center tomorrow for baby circ and will check BP at that time  POC in consult w. Dr. Resa Miner, MSN, CNM 08/11/2018, 11:31 AM

## 2018-08-09 DIAGNOSIS — O1495 Unspecified pre-eclampsia, complicating the puerperium: Secondary | ICD-10-CM | POA: Diagnosis not present

## 2018-08-23 DIAGNOSIS — O1495 Unspecified pre-eclampsia, complicating the puerperium: Secondary | ICD-10-CM | POA: Diagnosis not present

## 2018-08-23 DIAGNOSIS — O9122 Nonpurulent mastitis associated with the puerperium: Secondary | ICD-10-CM | POA: Diagnosis not present

## 2018-09-24 DIAGNOSIS — O1495 Unspecified pre-eclampsia, complicating the puerperium: Secondary | ICD-10-CM | POA: Diagnosis not present

## 2018-12-11 DIAGNOSIS — R278 Other lack of coordination: Secondary | ICD-10-CM | POA: Diagnosis not present

## 2018-12-11 DIAGNOSIS — M6281 Muscle weakness (generalized): Secondary | ICD-10-CM | POA: Diagnosis not present

## 2018-12-11 DIAGNOSIS — M545 Low back pain: Secondary | ICD-10-CM | POA: Diagnosis not present

## 2018-12-11 DIAGNOSIS — M6208 Separation of muscle (nontraumatic), other site: Secondary | ICD-10-CM | POA: Diagnosis not present

## 2018-12-21 DIAGNOSIS — R278 Other lack of coordination: Secondary | ICD-10-CM | POA: Diagnosis not present

## 2018-12-21 DIAGNOSIS — M6208 Separation of muscle (nontraumatic), other site: Secondary | ICD-10-CM | POA: Diagnosis not present

## 2018-12-21 DIAGNOSIS — M545 Low back pain: Secondary | ICD-10-CM | POA: Diagnosis not present

## 2018-12-21 DIAGNOSIS — H5213 Myopia, bilateral: Secondary | ICD-10-CM | POA: Diagnosis not present

## 2018-12-21 DIAGNOSIS — M6281 Muscle weakness (generalized): Secondary | ICD-10-CM | POA: Diagnosis not present

## 2019-03-31 DIAGNOSIS — Z20828 Contact with and (suspected) exposure to other viral communicable diseases: Secondary | ICD-10-CM | POA: Diagnosis not present

## 2019-08-05 DIAGNOSIS — Z20822 Contact with and (suspected) exposure to covid-19: Secondary | ICD-10-CM | POA: Diagnosis not present

## 2019-08-25 IMAGING — US ULTRASOUND ABDOMEN LIMITED
1 series · 15 of 25 positions shown · non-contrast
Comparison: None.

CLINICAL DATA: Epigastric pain.  Hypertension.

EXAM:
ULTRASOUND ABDOMEN LIMITED RIGHT UPPER QUADRANT

[Series 1: ultrasound abdomen limited · 15 of 27 slices shown]
[im 1/27]
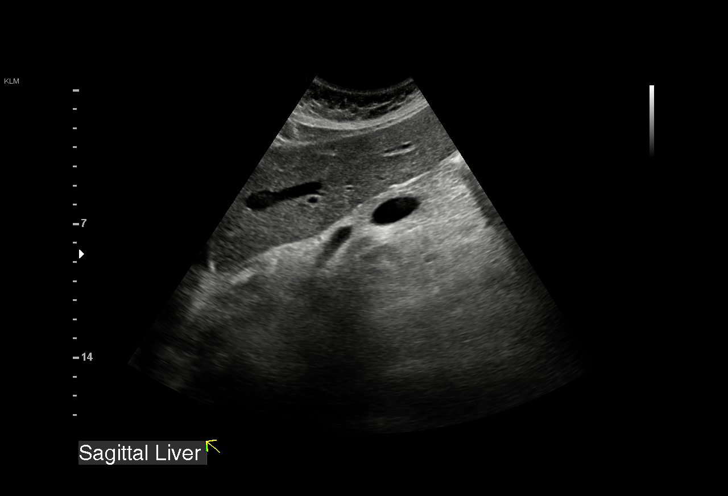
[im 3/27]
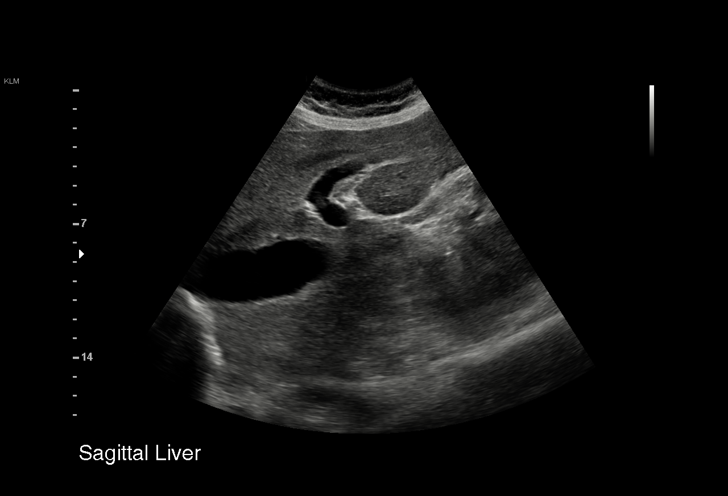
[im 5/27]
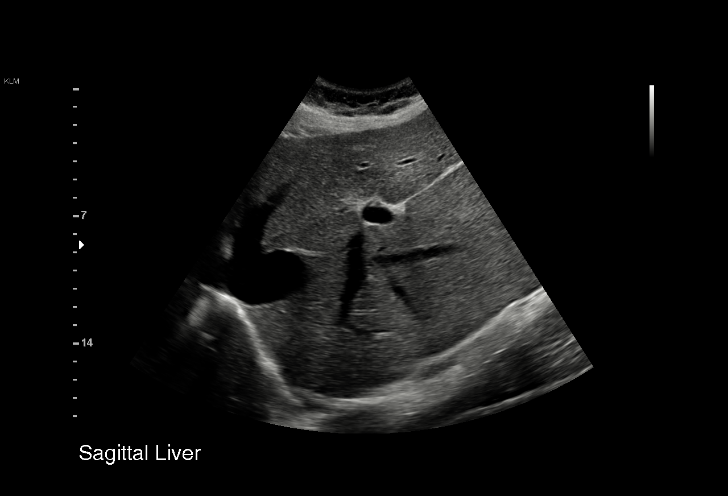
[im 6/27]
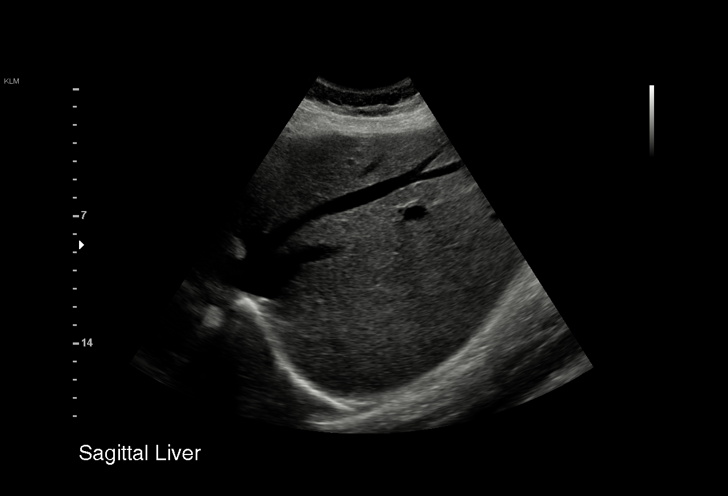
[im 8/27]
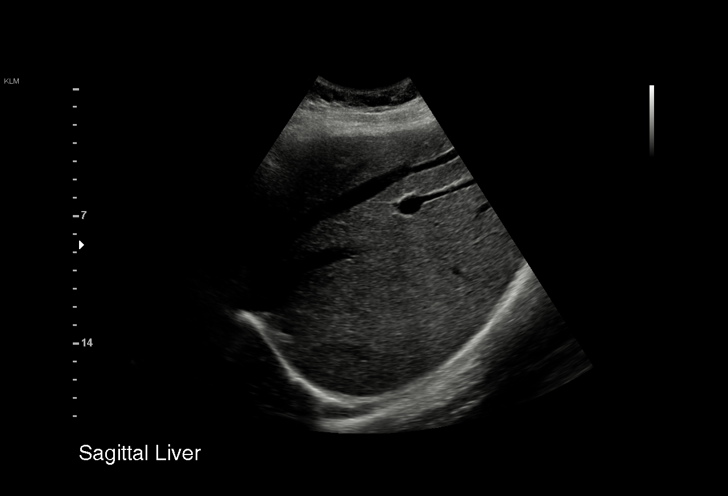
[im 10/27]
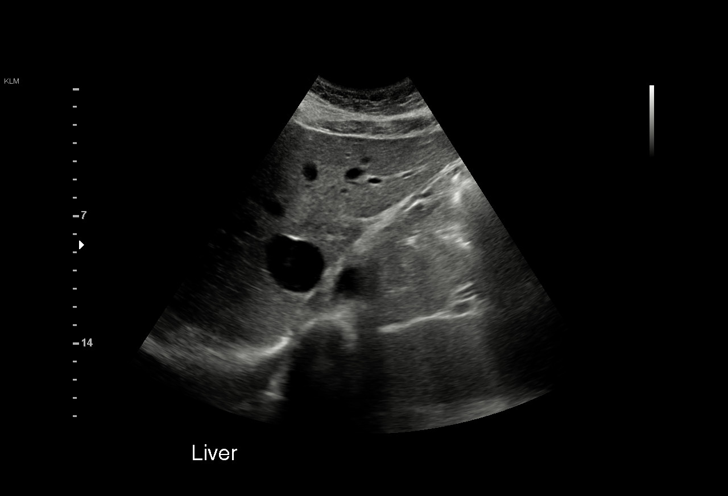
[im 11/27]
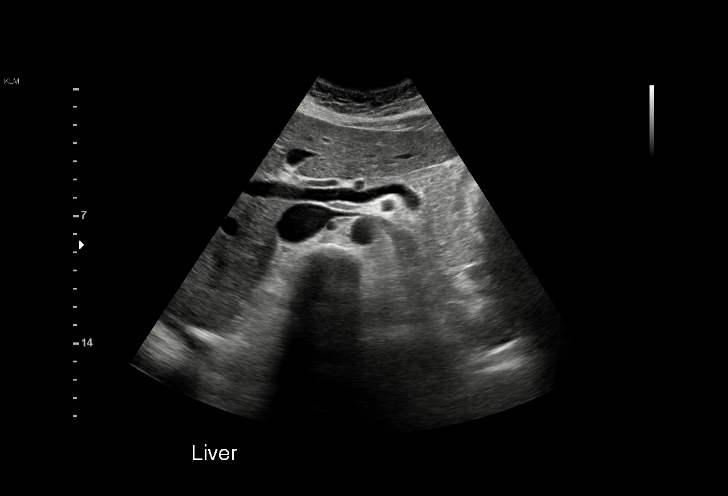
[im 14/27]
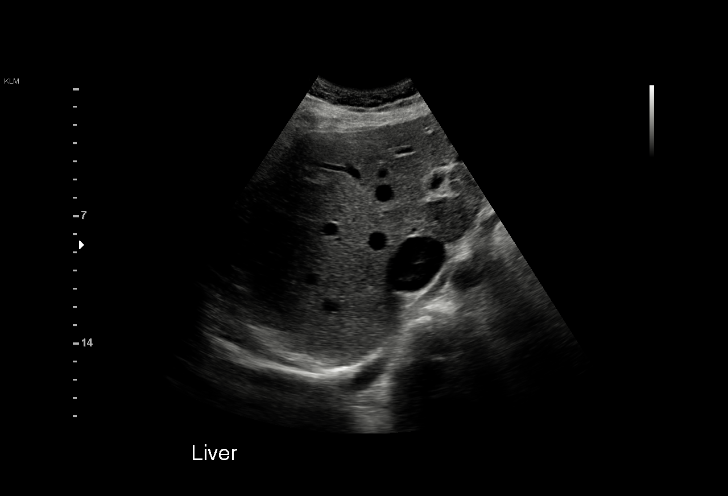
[im 16/27]
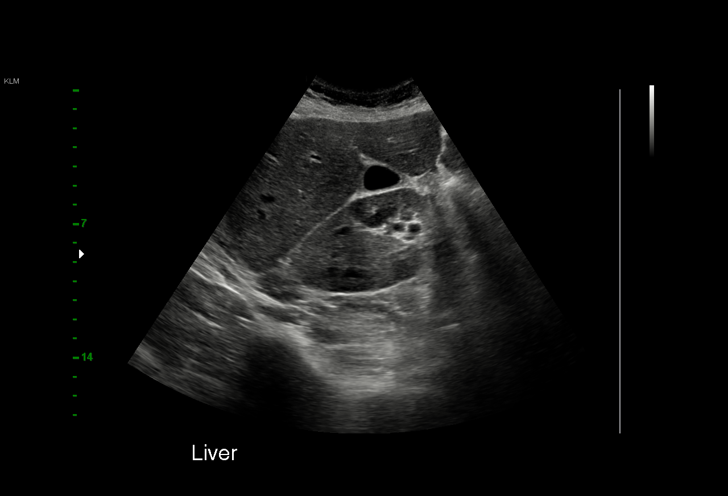
[im 17/27]
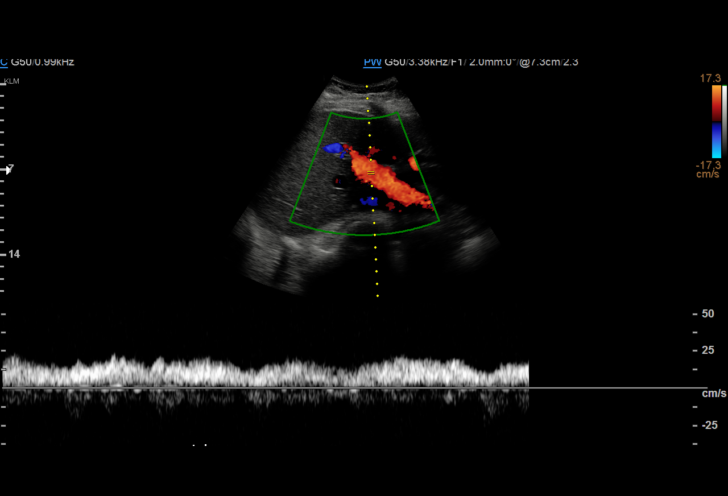
[im 19/27]
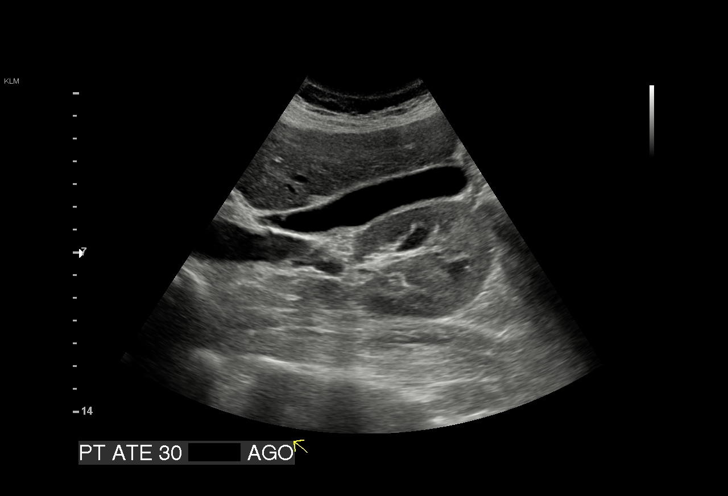
[im 21/27]
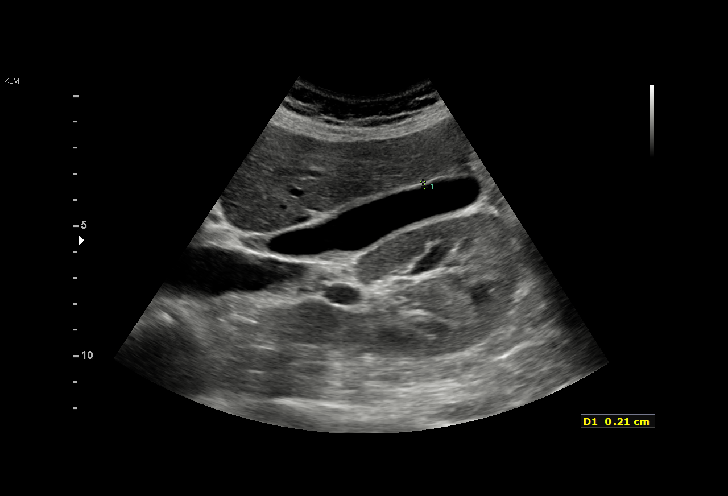
[im 22/27]
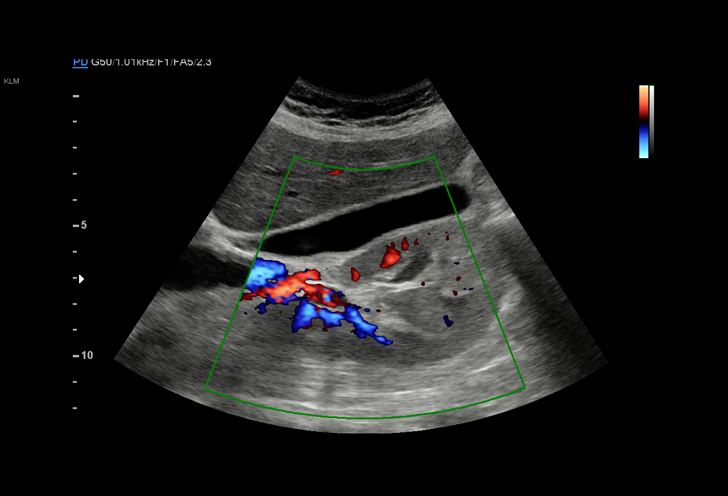
[im 24/27]
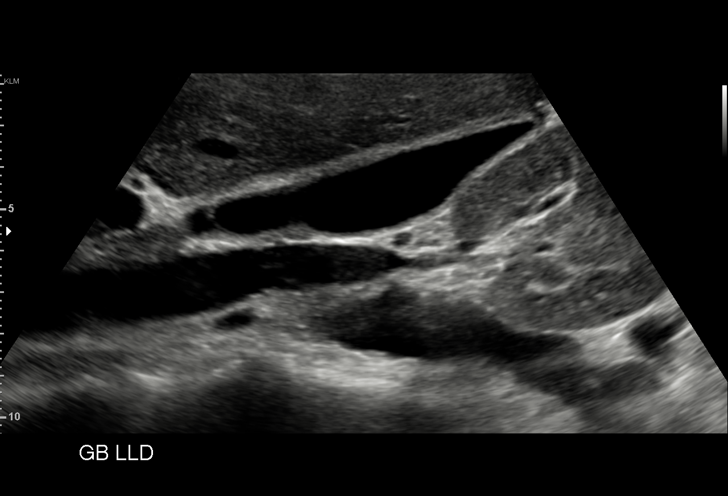
[im 27/27]
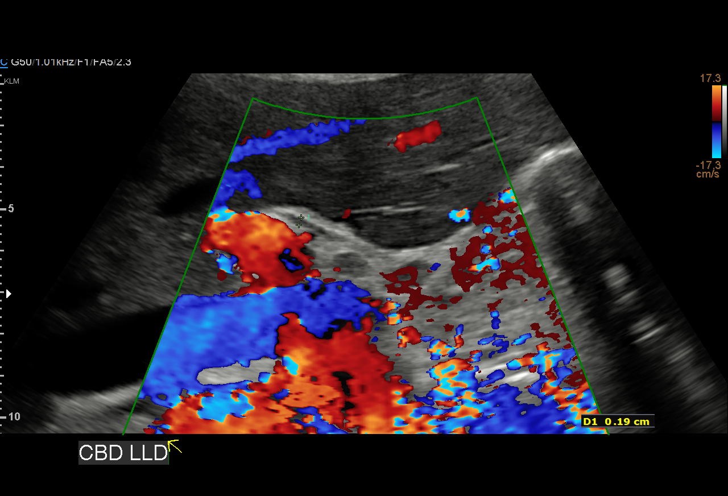

[15 of 25 positions shown; findings below may reference images not displayed]

FINDINGS: Gallbladder:

No gallstones or wall thickening visualized. No sonographic Murphy
sign noted by sonographer.

Common bile duct:

Diameter: 2 mm.

Liver:

No focal lesion identified. Within normal limits in parenchymal
echogenicity. Portal vein is patent on color Doppler imaging with
normal direction of blood flow towards the liver.
IMPRESSION: Normal right upper quadrant ultrasound.

## 2019-12-19 DIAGNOSIS — H5213 Myopia, bilateral: Secondary | ICD-10-CM | POA: Diagnosis not present

## 2020-07-28 DIAGNOSIS — Z20822 Contact with and (suspected) exposure to covid-19: Secondary | ICD-10-CM | POA: Diagnosis not present

## 2020-07-28 DIAGNOSIS — Z03818 Encounter for observation for suspected exposure to other biological agents ruled out: Secondary | ICD-10-CM | POA: Diagnosis not present

## 2020-07-28 DIAGNOSIS — J029 Acute pharyngitis, unspecified: Secondary | ICD-10-CM | POA: Diagnosis not present

## 2020-07-31 DIAGNOSIS — R059 Cough, unspecified: Secondary | ICD-10-CM | POA: Diagnosis not present

## 2020-07-31 DIAGNOSIS — H60393 Other infective otitis externa, bilateral: Secondary | ICD-10-CM | POA: Diagnosis not present

## 2020-07-31 DIAGNOSIS — J00 Acute nasopharyngitis [common cold]: Secondary | ICD-10-CM | POA: Diagnosis not present

## 2021-01-20 DIAGNOSIS — Z13220 Encounter for screening for lipoid disorders: Secondary | ICD-10-CM | POA: Diagnosis not present

## 2021-01-20 DIAGNOSIS — Z0000 Encounter for general adult medical examination without abnormal findings: Secondary | ICD-10-CM | POA: Diagnosis not present

## 2021-03-25 DIAGNOSIS — Z124 Encounter for screening for malignant neoplasm of cervix: Secondary | ICD-10-CM | POA: Diagnosis not present

## 2021-03-25 DIAGNOSIS — Z113 Encounter for screening for infections with a predominantly sexual mode of transmission: Secondary | ICD-10-CM | POA: Diagnosis not present

## 2021-03-25 DIAGNOSIS — Z01419 Encounter for gynecological examination (general) (routine) without abnormal findings: Secondary | ICD-10-CM | POA: Diagnosis not present

## 2021-03-25 DIAGNOSIS — Z6822 Body mass index (BMI) 22.0-22.9, adult: Secondary | ICD-10-CM | POA: Diagnosis not present

## 2021-03-25 DIAGNOSIS — Z1231 Encounter for screening mammogram for malignant neoplasm of breast: Secondary | ICD-10-CM | POA: Diagnosis not present

## 2022-04-20 DIAGNOSIS — Z6822 Body mass index (BMI) 22.0-22.9, adult: Secondary | ICD-10-CM | POA: Diagnosis not present

## 2022-04-20 DIAGNOSIS — Z01419 Encounter for gynecological examination (general) (routine) without abnormal findings: Secondary | ICD-10-CM | POA: Diagnosis not present

## 2022-04-20 DIAGNOSIS — Z124 Encounter for screening for malignant neoplasm of cervix: Secondary | ICD-10-CM | POA: Diagnosis not present

## 2022-05-06 DIAGNOSIS — Z1231 Encounter for screening mammogram for malignant neoplasm of breast: Secondary | ICD-10-CM | POA: Diagnosis not present

## 2022-06-17 DIAGNOSIS — Z0000 Encounter for general adult medical examination without abnormal findings: Secondary | ICD-10-CM | POA: Diagnosis not present

## 2022-06-17 DIAGNOSIS — Z13220 Encounter for screening for lipoid disorders: Secondary | ICD-10-CM | POA: Diagnosis not present

## 2023-02-25 DIAGNOSIS — R519 Headache, unspecified: Secondary | ICD-10-CM | POA: Diagnosis not present

## 2023-02-25 DIAGNOSIS — R0981 Nasal congestion: Secondary | ICD-10-CM | POA: Diagnosis not present

## 2023-02-25 DIAGNOSIS — R051 Acute cough: Secondary | ICD-10-CM | POA: Diagnosis not present

## 2023-02-25 DIAGNOSIS — R0989 Other specified symptoms and signs involving the circulatory and respiratory systems: Secondary | ICD-10-CM | POA: Diagnosis not present

## 2023-03-03 DIAGNOSIS — R052 Subacute cough: Secondary | ICD-10-CM | POA: Diagnosis not present

## 2023-03-03 DIAGNOSIS — R030 Elevated blood-pressure reading, without diagnosis of hypertension: Secondary | ICD-10-CM | POA: Diagnosis not present

## 2023-07-04 DIAGNOSIS — Z0000 Encounter for general adult medical examination without abnormal findings: Secondary | ICD-10-CM | POA: Diagnosis not present

## 2023-07-07 DIAGNOSIS — Z13220 Encounter for screening for lipoid disorders: Secondary | ICD-10-CM | POA: Diagnosis not present

## 2023-07-07 DIAGNOSIS — Z01419 Encounter for gynecological examination (general) (routine) without abnormal findings: Secondary | ICD-10-CM | POA: Diagnosis not present

## 2023-07-07 DIAGNOSIS — Z1231 Encounter for screening mammogram for malignant neoplasm of breast: Secondary | ICD-10-CM | POA: Diagnosis not present

## 2023-07-07 DIAGNOSIS — Z1331 Encounter for screening for depression: Secondary | ICD-10-CM | POA: Diagnosis not present

## 2023-07-07 DIAGNOSIS — N926 Irregular menstruation, unspecified: Secondary | ICD-10-CM | POA: Diagnosis not present

## 2023-07-07 DIAGNOSIS — Z1329 Encounter for screening for other suspected endocrine disorder: Secondary | ICD-10-CM | POA: Diagnosis not present

## 2023-07-07 DIAGNOSIS — Z0000 Encounter for general adult medical examination without abnormal findings: Secondary | ICD-10-CM | POA: Diagnosis not present

## 2023-07-07 DIAGNOSIS — Z131 Encounter for screening for diabetes mellitus: Secondary | ICD-10-CM | POA: Diagnosis not present

## 2023-08-21 DIAGNOSIS — N951 Menopausal and female climacteric states: Secondary | ICD-10-CM | POA: Diagnosis not present

## 2023-08-21 DIAGNOSIS — G479 Sleep disorder, unspecified: Secondary | ICD-10-CM | POA: Diagnosis not present

## 2023-08-21 DIAGNOSIS — E559 Vitamin D deficiency, unspecified: Secondary | ICD-10-CM | POA: Diagnosis not present

## 2023-08-21 DIAGNOSIS — N959 Unspecified menopausal and perimenopausal disorder: Secondary | ICD-10-CM | POA: Diagnosis not present

## 2023-09-06 ENCOUNTER — Emergency Department (HOSPITAL_BASED_OUTPATIENT_CLINIC_OR_DEPARTMENT_OTHER)

## 2023-09-06 ENCOUNTER — Other Ambulatory Visit: Payer: Self-pay

## 2023-09-06 ENCOUNTER — Emergency Department (HOSPITAL_BASED_OUTPATIENT_CLINIC_OR_DEPARTMENT_OTHER)
Admission: EM | Admit: 2023-09-06 | Discharge: 2023-09-06 | Disposition: A | Discharge status: Home / Self Care | Attending: Emergency Medicine | Admitting: Emergency Medicine

## 2023-09-06 ENCOUNTER — Encounter (HOSPITAL_BASED_OUTPATIENT_CLINIC_OR_DEPARTMENT_OTHER): Payer: Self-pay | Admitting: Emergency Medicine

## 2023-09-06 DIAGNOSIS — R1011 Right upper quadrant pain: Secondary | ICD-10-CM | POA: Insufficient documentation

## 2023-09-06 DIAGNOSIS — R1031 Right lower quadrant pain: Secondary | ICD-10-CM | POA: Insufficient documentation

## 2023-09-06 DIAGNOSIS — N8302 Follicular cyst of left ovary: Secondary | ICD-10-CM | POA: Diagnosis not present

## 2023-09-06 DIAGNOSIS — R188 Other ascites: Secondary | ICD-10-CM | POA: Diagnosis not present

## 2023-09-06 DIAGNOSIS — R109 Unspecified abdominal pain: Secondary | ICD-10-CM | POA: Diagnosis not present

## 2023-09-06 LAB — URINALYSIS, ROUTINE W REFLEX MICROSCOPIC
Bilirubin Urine: NEGATIVE
Glucose, UA: NEGATIVE mg/dL
Hgb urine dipstick: NEGATIVE
Ketones, ur: NEGATIVE mg/dL
Leukocytes,Ua: NEGATIVE
Nitrite: NEGATIVE
Protein, ur: NEGATIVE mg/dL
Specific Gravity, Urine: 1.009 (ref 1.005–1.030)
pH: 7 (ref 5.0–8.0)

## 2023-09-06 LAB — COMPREHENSIVE METABOLIC PANEL WITH GFR
ALT: 23 U/L (ref 0–44)
AST: 24 U/L (ref 15–41)
Albumin: 4.4 g/dL (ref 3.5–5.0)
Alkaline Phosphatase: 76 U/L (ref 38–126)
Anion gap: 9 (ref 5–15)
BUN: 16 mg/dL (ref 6–20)
CO2: 27 mmol/L (ref 22–32)
Calcium: 9.1 mg/dL (ref 8.9–10.3)
Chloride: 103 mmol/L (ref 98–111)
Creatinine, Ser: 0.8 mg/dL (ref 0.44–1.00)
GFR, Estimated: >60 mL/min (ref >60)
Glucose, Bld: 88 mg/dL (ref 70–99)
Potassium: 3.7 mmol/L (ref 3.5–5.1)
Sodium: 139 mmol/L (ref 135–145)
Total Bilirubin: 0.3 mg/dL (ref 0.0–1.2)
Total Protein: 6.7 g/dL (ref 6.5–8.1)

## 2023-09-06 LAB — CBC
HCT: 41.1 % (ref 36.0–46.0)
Hemoglobin: 13.6 g/dL (ref 12.0–15.0)
MCH: 29.2 pg (ref 26.0–34.0)
MCHC: 33.1 g/dL (ref 30.0–36.0)
MCV: 88.2 fL (ref 80.0–100.0)
Platelets: 254 10*3/uL (ref 150–400)
RBC: 4.66 MIL/uL (ref 3.87–5.11)
RDW: 13.2 % (ref 11.5–15.5)
WBC: 8.9 10*3/uL (ref 4.0–10.5)
nRBC: 0 % (ref 0.0–0.2)

## 2023-09-06 LAB — PREGNANCY, URINE: Preg Test, Ur: NEGATIVE

## 2023-09-06 LAB — LIPASE, BLOOD: Lipase: 31 U/L (ref 11–51)

## 2023-09-06 NOTE — ED Provider Notes (Signed)
 Agency EMERGENCY DEPARTMENT AT [facility] Provider Note   CSN: [id number] Arrival date & time: 09/06/23  0155     History  Chief Complaint  Patient presents with   Abdominal Pain    Anita Reyes is a 43 y.o. female.  Patient is a 43 year old female with history of childbirth x 5.  Patient presenting today for evaluation of right sided abdominal pain.  This has been ongoing for the past 3 or 4 days.  She describes a discomfort just under her right ribs that radiates into her lower abdomen.  Pain seems to be coming and going.  No bowel or bladder complaints.  She denies any fevers or chills.       Home Medications Prior to Admission medications   Medication Sig Start Date End Date Taking? Authorizing Provider  docusate sodium  (COLACE) 100 MG capsule Take 100 mg by mouth 2 (two) times daily as needed for mild constipation.    [provider]  ibuprofen  (ADVIL ,MOTRIN ) 600 MG tablet Take 1 tablet (600 mg total) by mouth every 6 (six) hours. 08/01/18   Sigmon, Meredith C, CNM  Prenatal Vit-Fe Fumarate-FA (PRENATAL MULTIVITAMIN) TABS tablet Take 1 tablet by mouth daily at 12 noon.    [provider]  simethicone  (MYLICON) 80 MG chewable tablet Chew 1 tablet (80 mg total) by mouth as needed for flatulence. 08/01/18   Sigmon, Meredith C, CNM      Allergies    Patient has no known allergies.    Review of Systems   Review of Systems  All other systems reviewed and are negative.   Physical Exam Updated Vital Signs BP (!) 165/95   Pulse 70   Temp 99.1 F (37.3 C) (Oral)   Resp 18   SpO2 100%  Physical Exam Vitals and nursing note reviewed.  Constitutional:      General: She is not in acute distress.    Appearance: She is well-developed. She is not diaphoretic.  HENT:     Head: Normocephalic and atraumatic.  Cardiovascular:     Rate and Rhythm: Normal rate and regular rhythm.     Heart sounds: No murmur heard.    No friction rub. No gallop.   Pulmonary:     Effort: Pulmonary effort is normal. No respiratory distress.     Breath sounds: Normal breath sounds. No wheezing.  Abdominal:     General: Bowel sounds are normal. There is no distension.     Palpations: Abdomen is soft.     Tenderness: There is abdominal tenderness in the right upper quadrant and right lower quadrant. There is no right CVA tenderness, left CVA tenderness, guarding or rebound.  Musculoskeletal:        General: Normal range of motion.     Cervical back: Normal range of motion and neck supple.  Skin:    General: Skin is warm and dry.  Neurological:     General: No focal deficit present.     Mental Status: She is alert and oriented to person, place, and time.     ED Results / Procedures / Treatments   Labs (all labs ordered are listed, but only abnormal results are displayed) Labs Reviewed  URINALYSIS, ROUTINE W REFLEX MICROSCOPIC - Abnormal; Notable for the following components:      Result Value   Color, Urine COLORLESS (*)    All other components within normal limits  LIPASE, BLOOD  COMPREHENSIVE METABOLIC PANEL WITH GFR  CBC  PREGNANCY, URINE  EKG None  Radiology No results found.  Procedures Procedures    Medications Ordered in ED Medications - No data to display  ED Course/ Medical Decision Making/ A&P  Patient is a 43 year old female presenting with complaints of right sided abdominal pain.  This has been ongoing for the past 3 days and began in the absence of any injury or trauma.  Patient arrives with stable vital signs and is afebrile.  Physical examination reveals mild tenderness to the right upper and lower quadrants, but no peritoneal signs.  Laboratory studies obtained including CBC, CMP, and lipase.  Patient has no leukocytosis, no elevation of liver or pancreatic enzymes, and no electrolyte derangement.  Urinalysis is basically clear and pregnancy test is negative.  CT scan with renal protocol obtained showing no  acute intra-abdominal pathology.  Patient declining pain medication.  At this point, cause of her abdominal pain is unclear, but nothing appears emergent.  Considered has been cholecystitis, appendicitis, kidney stone, or bowel obstruction, none of which are reflected by the CT scan.  Patient to be discharged with ibuprofen  and rest.    Final Clinical Impression(s) / ED Diagnoses Final diagnoses:  None    Rx / DC Orders ED Discharge Orders     None         Orvilla Blander, MD 09/06/23 [phone]

## 2023-09-06 NOTE — Discharge Instructions (Signed)
 Take ibuprofen  600 mg every 6 hours as needed for pain.  Follow-up with your primary doctor if not improving in the next few days, and return to the ER if you develop worsening pain, high fevers, bloody stools, or for other new and concerning symptoms.

## 2023-09-06 NOTE — ED Triage Notes (Signed)
 Patient presents with right sided abd pain since Saturday. Reports pain radiating to groin. +nausea. Denies vomiting or diarrhea

## 2023-11-28 DIAGNOSIS — L237 Allergic contact dermatitis due to plants, except food: Secondary | ICD-10-CM | POA: Diagnosis not present
